# Patient Record
Sex: Female | Born: 1945 | Race: White | Hispanic: No | Marital: Single | Smoking: Never smoker
Health system: Southern US, Community
[De-identification: ages and names within clinical notes are randomized; demographics above are authoritative.]

## PROBLEM LIST (undated history)

## (undated) DIAGNOSIS — N200 Calculus of kidney: Secondary | ICD-10-CM

## (undated) DIAGNOSIS — N289 Disorder of kidney and ureter, unspecified: Secondary | ICD-10-CM

## (undated) HISTORY — PX: STOMACH SURGERY: SHX791

## (undated) HISTORY — PX: LITHOTRIPSY: SUR834

## (undated) HISTORY — PX: MULTIPLE TOOTH EXTRACTIONS: SHX2053

---

## 2014-08-07 ENCOUNTER — Emergency Department: Payer: Self-pay | Admitting: Emergency Medicine

## 2014-08-07 DIAGNOSIS — E860 Dehydration: Secondary | ICD-10-CM | POA: Diagnosis not present

## 2014-08-07 DIAGNOSIS — K529 Noninfective gastroenteritis and colitis, unspecified: Secondary | ICD-10-CM | POA: Diagnosis not present

## 2014-08-07 DIAGNOSIS — R112 Nausea with vomiting, unspecified: Secondary | ICD-10-CM | POA: Diagnosis not present

## 2014-08-07 DIAGNOSIS — R1084 Generalized abdominal pain: Secondary | ICD-10-CM | POA: Diagnosis not present

## 2014-08-07 DIAGNOSIS — N390 Urinary tract infection, site not specified: Secondary | ICD-10-CM | POA: Diagnosis not present

## 2014-08-07 DIAGNOSIS — R05 Cough: Secondary | ICD-10-CM | POA: Diagnosis not present

## 2014-08-09 DIAGNOSIS — E860 Dehydration: Secondary | ICD-10-CM | POA: Diagnosis not present

## 2014-08-09 DIAGNOSIS — R1032 Left lower quadrant pain: Secondary | ICD-10-CM | POA: Diagnosis not present

## 2014-08-09 DIAGNOSIS — R112 Nausea with vomiting, unspecified: Secondary | ICD-10-CM | POA: Diagnosis not present

## 2014-08-09 DIAGNOSIS — D72829 Elevated white blood cell count, unspecified: Secondary | ICD-10-CM | POA: Diagnosis not present

## 2014-08-09 DIAGNOSIS — R000 Tachycardia, unspecified: Secondary | ICD-10-CM | POA: Diagnosis not present

## 2014-08-09 DIAGNOSIS — E8809 Other disorders of plasma-protein metabolism, not elsewhere classified: Secondary | ICD-10-CM | POA: Diagnosis not present

## 2014-08-10 ENCOUNTER — Inpatient Hospital Stay: Payer: Self-pay | Admitting: Urology

## 2014-08-10 DIAGNOSIS — N132 Hydronephrosis with renal and ureteral calculous obstruction: Secondary | ICD-10-CM | POA: Diagnosis not present

## 2014-08-10 DIAGNOSIS — Z801 Family history of malignant neoplasm of trachea, bronchus and lung: Secondary | ICD-10-CM | POA: Diagnosis not present

## 2014-08-10 DIAGNOSIS — N1330 Unspecified hydronephrosis: Secondary | ICD-10-CM | POA: Diagnosis not present

## 2014-08-10 DIAGNOSIS — Z91041 Radiographic dye allergy status: Secondary | ICD-10-CM | POA: Diagnosis not present

## 2014-08-10 DIAGNOSIS — E876 Hypokalemia: Secondary | ICD-10-CM | POA: Diagnosis not present

## 2014-08-10 DIAGNOSIS — N390 Urinary tract infection, site not specified: Secondary | ICD-10-CM | POA: Diagnosis not present

## 2014-08-10 DIAGNOSIS — K219 Gastro-esophageal reflux disease without esophagitis: Secondary | ICD-10-CM | POA: Diagnosis not present

## 2014-08-10 DIAGNOSIS — K5730 Diverticulosis of large intestine without perforation or abscess without bleeding: Secondary | ICD-10-CM | POA: Diagnosis not present

## 2014-08-10 DIAGNOSIS — R1032 Left lower quadrant pain: Secondary | ICD-10-CM | POA: Diagnosis not present

## 2014-08-10 DIAGNOSIS — N201 Calculus of ureter: Secondary | ICD-10-CM | POA: Diagnosis not present

## 2014-08-17 DIAGNOSIS — D649 Anemia, unspecified: Secondary | ICD-10-CM | POA: Diagnosis not present

## 2014-08-17 DIAGNOSIS — E876 Hypokalemia: Secondary | ICD-10-CM | POA: Diagnosis not present

## 2014-08-17 DIAGNOSIS — N200 Calculus of kidney: Secondary | ICD-10-CM | POA: Diagnosis not present

## 2014-08-23 ENCOUNTER — Ambulatory Visit: Payer: Self-pay

## 2014-08-23 DIAGNOSIS — N201 Calculus of ureter: Secondary | ICD-10-CM | POA: Diagnosis not present

## 2014-08-23 DIAGNOSIS — Z960 Presence of urogenital implants: Secondary | ICD-10-CM | POA: Diagnosis not present

## 2014-08-23 DIAGNOSIS — Z9889 Other specified postprocedural states: Secondary | ICD-10-CM | POA: Diagnosis not present

## 2014-08-23 DIAGNOSIS — N200 Calculus of kidney: Secondary | ICD-10-CM | POA: Diagnosis not present

## 2014-08-23 DIAGNOSIS — N209 Urinary calculus, unspecified: Secondary | ICD-10-CM | POA: Diagnosis not present

## 2014-08-29 DIAGNOSIS — N201 Calculus of ureter: Secondary | ICD-10-CM | POA: Diagnosis not present

## 2014-09-01 ENCOUNTER — Ambulatory Visit: Payer: Self-pay | Admitting: Urology

## 2014-09-01 DIAGNOSIS — N200 Calculus of kidney: Secondary | ICD-10-CM | POA: Diagnosis not present

## 2014-09-01 DIAGNOSIS — Z79899 Other long term (current) drug therapy: Secondary | ICD-10-CM | POA: Diagnosis not present

## 2014-09-01 DIAGNOSIS — N201 Calculus of ureter: Secondary | ICD-10-CM | POA: Diagnosis not present

## 2014-09-14 DIAGNOSIS — N209 Urinary calculus, unspecified: Secondary | ICD-10-CM | POA: Diagnosis not present

## 2014-09-15 ENCOUNTER — Ambulatory Visit
Admit: 2014-09-15 | Disposition: A | Discharge status: Home / Self Care | Payer: Self-pay | Attending: Urology | Admitting: Urology

## 2014-09-15 DIAGNOSIS — N200 Calculus of kidney: Secondary | ICD-10-CM | POA: Diagnosis not present

## 2014-09-15 DIAGNOSIS — Z9889 Other specified postprocedural states: Secondary | ICD-10-CM | POA: Diagnosis not present

## 2014-09-15 DIAGNOSIS — N201 Calculus of ureter: Secondary | ICD-10-CM | POA: Diagnosis not present

## 2014-09-20 DIAGNOSIS — N201 Calculus of ureter: Secondary | ICD-10-CM | POA: Diagnosis not present

## 2014-09-30 ENCOUNTER — Other Ambulatory Visit: Payer: Self-pay | Admitting: Urology

## 2014-09-30 DIAGNOSIS — N200 Calculus of kidney: Secondary | ICD-10-CM

## 2014-10-09 DIAGNOSIS — R42 Dizziness and giddiness: Secondary | ICD-10-CM | POA: Diagnosis not present

## 2014-10-09 DIAGNOSIS — R030 Elevated blood-pressure reading, without diagnosis of hypertension: Secondary | ICD-10-CM | POA: Diagnosis not present

## 2014-10-12 DIAGNOSIS — H8110 Benign paroxysmal vertigo, unspecified ear: Secondary | ICD-10-CM | POA: Diagnosis not present

## 2014-10-16 NOTE — Discharge Summary (Signed)
PATIENT NAME:  Roxan HockeyHOMPSON, Ivyana D MR#:  [id number] DATE OF BIRTH:  [birth year]  DATE OF ADMISSION:  08/10/2014 DATE OF DISCHARGE:  08/11/2014  DISCHARGE DIAGNOSES:  1.  Ureteral stone stent placement. 2.  Urinary tract infection. 3.  Hypokalemia.  DISCHARGE MEDICATIONS: 1.  Ondansetron 4 mg oral tablet disintegrating every 4 hours.  3.  Acetaminophen/oxycodone 325/5 mg oral tablet 4 times a day as needed for pain.  4.  Ciprofloxacin 500 mg oral tablet every 12 hours for 4 days.  5.  Flomax 0.4 mg oral capsule once a day.  6.  Oxybutynin 5 mg oral tablet 3 times a day as needed for spasm and pain.   DIET ON DISCHARGE: Regular supplement Ensure.   DIET CONSISTENCY: Regular.   ACTIVITY: As tolerated.   TIMEFRAME TO FOLLOW-UP: [facility]   HISTORY OF PRESENTING ILLNESS: A 69 year old female who has no significant past medical history presented to the Emergency Room with several days' episode of left lower abdominal pain associated with nausea and vomiting and low-grade fever. In the Emergency Department she was found to have a left ureteropelvic junction stone  and she had urinary tract infection. Urology consult was called in and they took her to the operating room and placed a stent in the left ureter and advised to admit the patient to medical service for further monitoring and evaluation of symptoms, so she was admitted to medical services.   HOSPITAL COURSE: Ureteral stone. After placing a stent by urology admitted to medical services. Her pain resolved. She remained on the painmedication.  She had improvement in condition and we discharged her the next day with advice to follow up in the clinic.  Urinary tract infection.  In the hospital urine culture was checked which was negative. She was continued on Cipro and discharged on the same.   Hypokalemia. We give oral replacement.  IMPORTANT LABORATORY RESULTS: Troponin less than 0.02. WBC 18.3 on  admission, hemoglobin was 13, and platelet count was 278,000. Creatinine was 1.60, potassium 3.6, and chloride was 104. Urinalysis was positive with 39 WBCs and 1+ leukocyte esterase.  Further follow-up urinalysis was negative. CT scan of the abdomen and pelvis with contrast showed stone in the left  ureter with hydroureter necrosis and perinephric stranding.  WBC count can down to 10,000 on further follow-up.   TOTAL TIME SPENT ON THIS DISCHARGE:  40 minutes.    ____________________________ Hope PigeonVaibhavkumar G. Elisabeth PigeonVachhani, MD vgv:mc D: 08/14/2014 07:41:57 ET T: 08/14/2014 08:57:25 ET JOB#: [id number]  cc: Hope PigeonVaibhavkumar G. Elisabeth PigeonVachhani, MD, <Dictator> [facility]   [facility]-Er[facility]IBHAVKUMAR Veeda Virgo MD ELECTRONICALLY SIGNED 08/29/2014 12:58

## 2014-10-16 NOTE — H&P (Signed)
PATIENT NAME:  Yvette Woodward, Kenedy D MR#:  [id number] DATE OF BIRTH:  08-02-1945  DATE OF ADMISSION:  08/10/2014  REFERRING PHYSICIAN: Bertram MillardStephen M. Dahlstedt, MD, urologist.  PRIMARY CARE PRACTITIONER: Baruch GoutyMelinda Lada, MD, of [facility].    ADMITTING DOCTOR: Crissie FiguresEdavally N. Shatera Rennert, MD    CHIEF COMPLAINT: Status post urological procedure, cystoscopy and ureteroscopy with left retrograde and left double-J stent placement.   HISTORY OF PRESENT ILLNESS: A 69 year old Caucasian female with no significant past medical history who presented to the Emergency Room earlier last night with several days of left lower abdominal pain with associated nausea and vomiting and low grade fever, was evaluated by the ED physician and was noted to have a left uteropelvic junction stone, which was obstructing and infected. Urology consultation was obtained following which the patient was taken to the OR and underwent urological procedure in early in the early hours of 08/10/2014. Urologist requested a medical consultation and admission to the medical unit for further management. The patient is status post cystoscopy, ureteroscopy, left retrograde, and left double J stent placement done earlier today. Remains, at the current time, in the PACU in stable condition. As mentioned earlier, patient has been having some ongoing left lower abdominal pain with associated nausea, vomiting, low-grade fever for the past few for the several weeks for which she was evaluated and was noted to have an left to the UPJ stone which was obstructing and infected. The patient underwent urological procedure. The patient currently complains of some pain following the procedure. Otherwise, denies any nausea or vomiting at this time. The patient received IV pain medication and also pain control suppository just a few minutes ago.   PAST MEDICAL HISTORY: No history of hypertension, diabetes. No history of any kidney stones in the past.   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   HOME MEDICATIONS:  1.  Bactrim DS.  2.  Ondansetron. 3.  Promethazine for the past few days.   ALLERGIES: CONTRAST DYE, ERYTHROMYCIN.    PAST SURGICAL HISTORY: Tonsillectomy and adenoidectomy.   SOCIAL HISTORY: She is single. No history of smoking. Occasional alcohol intake. Denies any drug usage.   FAMILY HISTORY: Mother with a respiratory failure and father with oat cell lung cancer.   REVIEW OF SYSTEMS:  CONSTITUTIONAL: Positive for low-grade fever with chills for the past several weeks. No weakness. No fatigue.  EYES: Negative for blurred vision, double vision. No pain. No redness. No discharge.  EARS, NOSE, AND THROAT: Negative for ear pain, tinnitus, hearing loss, epistaxis.  RESPIRATORY: Negative for cough, wheezing, dyspnea, hemoptysis, painful respiration.  CARDIOVASCULAR: Negative for chest pain, palpitations, dizziness, syncopal episodes, orthopnea, dyspnea on exertion, pedal edema.  GASTROINTESTINAL: Positive for several weeks of nausea and vomiting with left lower abdominal pain. No hematemesis. No melena.  GENITOURINARY: Positive for frequency, urgency with dysuria for the past several weeks, as noted in the history of present illness.  ENDOCRINE: Negative for were polyuria, nocturia, heat or cold intolerance.  HEMATOLOGIC AND LYMPHATIC: Negative for anemia, easy bruising or bleeding, or swollen glands.  INTEGUMENTARY: Negative for acne, skin rash, or lesions.  MUSCULOSKELETAL: Negative for arthritis, gout.  NEUROLOGICAL: Negative for focal weakness, numbness. No history of CVA or TIA.  PSYCHIATRIC: Negative for anxiety, insomnia, depression.   PHYSICAL EXAMINATION:  VITAL SIGNS: Temperature 97.9 degrees Fahrenheit, pulse rate 101 per minute, respirations 15 per minute, blood pressure 143/86, oxygen saturation 96% on room air; these vitals were taken in the PACU.  GENERAL: Well-developed, well-nourished, alert, in mild distress  because of the pain following urological procedure. Otherwise, comfortably resting in the bed.  HEAD: Atraumatic, normocephalic.  EYES: Pupils are equal, react to light and accommodation. No conjunctival pallor. No icterus. Extraocular movements intact.  NOSE: No drainage. No lesions.  EARS: No drainage. No external lesions.  ORAL CAVITY: No mucosal lesions. No exudates.  NECK: Supple. No JVD. No thyromegaly. No carotid bruit. Range of motion of neck within normal limits.  RESPIRATORY: Good respiratory effort. Not using accessory muscles of respiration. Bilateral vesicular breath sounds present. No rales or rhonchi.  CARDIOVASCULAR: S1, S2 regular. No murmurs, gallops, or clicks. Peripheral pulses equal at carotid, femoral, and pedal pulses. No peripheral edema.  GASTROINTESTINAL: Abdomen is soft. Mild left flank tenderness. Otherwise, no hepatosplenomegaly. No rigidity. No guarding. Bowel sounds present and equal in all 4 quadrants.  GENITOURINARY: Deferred.  MUSCULOSKELETAL: No joint tenderness or effusion. Range of motion is adequate. Strength and tone equal bilaterally.  SKIN: Inspection within normal limits. No obvious wounds.  LYMPHATIC: No cervical lymphadenopathy.  VASCULAR: Good dorsalis pedis and posterior tibial pulses.  NEUROLOGICAL: Alert, awake, and oriented x 3. Cranial nerves II-XII grossly intact. No zoster deficit. Motor strength 5/5 in both upper and lower extremities.  PSYCHIATRIC: Alert, awake, and oriented x 3. Judgment and insight adequate. Memory and mood within normal limits.   ANCILLARY DATA: LABORATORY DATA:  Serum glucose 100, BUN 23, creatinine 1.3, sodium 143, potassium 3.6, chloride 110. Serum bicarbonate 23, calcium 9.1, lipase 90, total protein 7.4, albumin 2.4, total bilirubin 0.4, alkaline phosphatase 140, AST 23, ALT 7. WBC 17.3, hemoglobin 11.7, hematocrit 35.0, platelet count 291,000. Urinalysis: WBC 6 per high-power field, bacteria 1+.   IMAGING STUDIES:  CT of the abdomen and the pelvis:  1.  Obstructing 10 x 9 mm stone in the left proximal ureter with resultant moderate hydroureteronephrosis and perinephric stranding. No additional nonobstructive stones associated with either kidney.  2.  Diverticulosis without diverticulitis.  3.  Small hiatus hernia.   ASSESSMENT AND PLAN: A 69 year old Caucasian female with no significant past medical history who presented to the Emergency Room with several weeks of left-sided abdominal pain with associated nausea and vomiting and low-grade fever and was found to have left ureteral obstructive stone with infection. The patient is status post urological procedure done on 08/10/2014. Admitted to the hospitalist service for further management.  1.  Status post urological procedure, which is cystoscopy, urethroscopy, left retrograde, and left double-J stent placement for infected obstructed left ureteral stone.  PLAN: Admit to medicine. Intravenous pain control medications, intravenous Cipro, intravenous fluids, start clear liquids. Follow up labs and clinically.  2.  Deep vein thrombosis prophylaxis: Sequential compression devices.  3.  Gastrointestinal prophylaxis: Proton pump inhibitor.   CODE STATUS: Full code.   TIME SPENT: 50 minutes.     ____________________________ Crissie Figures, MD enr:bm D: 08/10/2014 05:36:26 ET T: 08/10/2014 05:59:28 ET JOB#: [id number]  cc: Crissie Figures, MD, <Dictator> Baruch Gouty, MD Crissie Figures MD ELECTRONICALLY SIGNED 08/11/2014 13:24

## 2014-10-16 NOTE — Op Note (Signed)
PATIENT NAME:  Yvette Woodward, [name] MR#:  [id number] DATE OF BIRTH:  1945-11-09  DATE OF PROCEDURE:  08/10/2014  PREOPERATIVE DIAGNOSIS:  Obstructing left proximal ureteral stone with pyuria.  POSTOPERATIVE DIAGNOSIS:  Obstructing left proximal ureteral stone with pyuria.  PRINCIPLE PROCEDURE:  Cystoscopy, left retrograde ureteral pyelogram with interpretive fluoroscopy, placement of double J stent in left ureter (24 cm x 6 [place]French Contour without string).    SURGEON: Marcine MatarStephen Dahlstedt, MD  ANESTHESIA:  General endotracheal.  COMPLICATIONS:  None.  INDICATIONS:  This is a 69 year old female with longstanding history of intermittent nausea, vomiting and left lower quadrant pain.  The patient has been seen twice prior to her presentation in the Emergency Room tonight for the above-mentioned symptoms.  She was found to have pyuria, leukocytosis and has had chills for several days.  She has had significant low oral intake.  Evaluation in the Emergency Room, beside the CBC showing leukocytosis and infected urinalysis, included a CT of the abdomen and pelvis.  This revealed significant left hydronephrosis behind an obstructing left proximal ureteral stone 10 mm in size.    Because of the patient's above presentation, it was recommended that she undergo urgent stent placement with eventual definitive stone management after antibiotic therapy.  Risks and complications of the procedure were discussed with the patient.  She will admitted to the hospitalist service.  DESCRIPTION OF PROCEDURE:  The patient was identified in the holding area.  Left side was marked.  She was taken to the operating room where general anesthetic was administered with the endotracheal apparatus. She was placed in the dorsal lithotomy position.  Genitalia and perineum were prepped and draped.  Proper timeout was performed.    A 22 French pan endoscope was advanced into the bladder.  The bladder was inspected circumferentially.  There  were no tumors, trabeculations, or foreign bodies.  The ureteral orifices were normal in configuration location.  Mild squamous metaplasia changes in the trigone.  The left ureter was cannulated with an open ended catheter.  General retrograde pyelogram was performed revealing an obstructing area in the left UPJ with minimal contrast around this.  The ureter was of normal caliber distal to this.  I then advanced the open ended catheter up the patient's ureter.  It was difficult to negotiate a guidewire by the obstructing stone.  I first used a sensor tip guidewire.  This was eventually switched to an angle tip guidewire.  This was eventually navigated past the obstructing stone.  I think, to verify position of the guidewire, advanced the open ended catheter into the left renal pelvis where a hydronephrotic drip was identified. The guidewire was replaced.  The open ended catheter was then removed.  Over top of the guidewire, I then placed a 24 cm x 5 French Contour catheter.  Once adequate positioning was visualized with the cystoscope and the fluoroscope, the guidewire was removed. The proximal and distal curls were seen.  There was significant purulent urine coming through the stent after removal of the guidewire. The bladder was decompressed. The scope was then removed.    The patient was awakened and taken to the PACU in stable condition.  She was administered ciprofloxacin, 400 mg prior to the procedure. She will be admitted on the medicine service for postoperative management.  She will be followed by Dr. Apolinar JunesBrandon.    ____________________________ Bertram MillardStephen M. Dahlstedt, MD :mc D: 08/10/2014 04:13:55 ET T: 08/10/2014 08:58:08 ET JOB#: [id number]  cc: Bertram MillardStephen M. Dahlstedt, MD, <Dictator> STEPHEN  Elza Rafter MD ELECTRONICALLY SIGNED 09/16/2014 11:31

## 2014-10-16 NOTE — H&P (Signed)
Subjective/Chief Complaint Abdominal pain, nausea/emesis   History of Present Illness 69 year old female presented to [facility] ER last night with an extended h/o mild LLQ pain as well as several weeks of nausea and vomiting.  Over the last several weeks she has had decreased [po intake due to the above symptoms. She has been to seek medical attention 3 days ago as well as this am with a PCP in [place]. She has recently experienced chills and perhaps low grade fever. Tues am she was put on an abx as well as a nausea med.   In the ER she was found to have a significant leukocytosis, and a few days ago there were increased WBC in urine. U/A today reveled fewere WBC but bacteria. CT A/P w/ contrast revealed  a 10 mm obstructing UPJ stone. Because of above history, it was recommended that she be admitted after urgent ureteral stent placement.   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  No other PMH   Primary Physician [facility]   Code Status Full Code   Past Med/Surgical Hx:  GERD - Esophageal Reflux:   hiatal hernia:   Denies medical history:   Other, see comments: abdominal surgery tumor removed  Tonsillectomy and Adenoidectomy:   ALLERGIES:  Contrast dye: N/V/Diarrhea  Erythromycin: Unknown  Family and Social History:  Family History Non-Contributory   Place of Living Home   Review of Systems:  Fever/Chills Yes   Cough No   Sputum No   Abdominal Pain Yes   Diarrhea No   Constipation No   Nausea/Vomiting Yes   SOB/DOE No   Chest Pain No   Telemetry Reviewed NSR   Dysuria No   Tolerating Diet No   Physical Exam:  GEN well developed, well nourished, thin   HEENT poor dentition   NECK supple  No masses   RESP normal resp effort  clear BS   CARD regular rate  no murmur  No LE edema   ABD positive tenderness  positive Flank Tenderness  no liver/spleen enlargement  normal BS   LYMPH negative neck   EXTR  negative cyanosis/clubbing, negative edema   SKIN normal to palpation   NEURO cranial nerves intact   PSYCH alert, A+O to time, place, person   Lab Results: Routine Chem:  23-Feb-16 17:16   Creatinine (comp) 1.30  eGFR (Non-African American)  43 (eGFR values <41m/min/1.73 m2 may be an indication of chronic kidney disease (CKD). Calculated eGFR, using the MRDR Study equation, is useful in  patients with stable renal function. The eGFR calculation will not be reliable in acutely ill patients when serum creatinine is changing rapidly. It is not useful in patients on dialysis. The eGFR calculation may not be applicable to patients at the low and high extremes of body sizes, pregnant women, and vegetarians.)  Routine Hem:  23-Feb-16 17:16   WBC (CBC)  17.3  Hemoglobin (CBC)  11.7  Hematocrit (CBC) 35.0  Platelet Count (CBC) 291   Radiology Results: XRay:    21-Feb-16 16:16, Chest PA and Lateral  Chest PA and Lateral  REASON FOR EXAM:    cough  COMMENTS:   LMP: Post Hysterectomy    PROCEDURE: DXR - DXR CHEST PA (OR AP) AND LATERAL  - Aug 07 2014  4:16PM     CLINICAL DATA:  Cough, nausea, vomiting and diarrhea for 4 days.    EXAM:  CHEST  2 VIEW  COMPARISON:  None.    FINDINGS:  Normal sized heart. Clear lungs. Right shoulder calcifications. Left  shoulder degenerative changes. Left upper quadrant abdominal  surgical clips and staples.     IMPRESSION:  No acute abnormality.      Electronically Signed    By: Claudie Revering M.D.    On: 08/07/2014 16:21         Verified By: Gerald Stabs, M.D.,  CT:    24-Feb-16 00:28, CT Abdomen and Pelvis With Contrast  CT Abdomen and Pelvis With Contrast  REASON FOR EXAM:    (1) LLQ pain, leukocytosis, vomiting; (2) LLQ pain,   leukocytosis, vomiting  COMMENTS:   May transport without cardiac monitor    PROCEDURE: CT  - CT ABDOMEN / PELVIS  W  - Aug 10 2014 12:28AM     CLINICAL DATA:  Nausea, vomiting, cough and body  aches. Left lower  quadrant pain. Symptoms for 5-6 days. Elevated white blood cell  count.    EXAM:  CT ABDOMEN AND PELVIS WITH CONTRAST    TECHNIQUE:  Multidetector CT imaging of the abdomen and pelvis was performed  using the standard protocol following bolus administration of  intravenous contrast.    CONTRAST:  85 mL Omnipaque 300 IV.    COMPARISON:  None.    FINDINGS:  There is minimal linear atelectasis at the lung bases. The lung  bases are otherwise clear. Small hiatal hernia.    Obstructing 10 x 9 mm stone in the left proximal ureter with  resultant moderate hydroureteronephrosis and mild perinephric  stranding of the left kidney. The left ureter distal to the stone is  decompressed. There is delayed enhancement and excretion of the left  kidney. There are no additional nonobstructing stones. There are no  right renal stones or right obstructive uropathy.    There are no focal hepatic lesions. Trace periportal edema may be  related to hydration status. The gallbladder is physiologically  distended. The spleen is normal. Minimal thickening of the left  adrenal gland without discrete nodule. Right adrenal gland is  normal. Mild pancreatic atrophy and prominence of the main  pancreatic duct. No peripancreatic inflammatory change.    Stomach is normally distended. There is postsurgical change in the  gastric cardia. There are no dilated or thickened bowel loops.  Moderate colonic diverticulosis primarily in the distal colon  without diverticulitis. The appendix is not definitively identified.  The abdominal aorta is normal in caliber. There is no  retroperitoneal adenopathy. No free air, free fluid, or  intra-abdominal fluid collection.    Within the pelvis the bladder is decompressed. Uterus and adnexa are  normal for age. No adnexal mass. No pelvic free fluid.    There is degenerative change in the lower lumbar spine with  primarily facet arthropathy at L4-L5 and  L5-S1. No acute or  suspicious osseous abnormality.     IMPRESSION:  1. Obstructing 10 x9 mm stone in the left proximal ureter with  resultant moderate hydroureteronephrosis and perinephric stranding.  No additional nonobstructing renal stones in either kidney.  2. Diverticulosis without diverticulitis.  3. Small hiatal hernia.      Electronically Signed    By: Jeb Levering M.D.    On: 08/10/2014 01:03         Verified By: Rollene Fare. Marisue Humble, M.D.,    Assessment/Admission Diagnosis Probable infected/obstructing left UPJ stone. Associated long trerm N/V   Plan I have recommended urgent cysto/left J2 stent placement. Risks,  complications as well as eventual stone management discussed w/ pt/   Electronic Signatures: Dahlstedt, Lillette Boxer (MD)  (Signed 24-Feb-16 02:41)  Authored: CHIEF COMPLAINT and HISTORY, PAST MEDICAL/SURGIAL HISTORY, ALLERGIES, FAMILY AND SOCIAL HISTORY, REVIEW OF SYSTEMS, PHYSICAL EXAM, LABS, Radiology, ASSESSMENT AND PLAN   Last Updated: 24-Feb-16 02:41 by Jorja Loa (MD)

## 2014-10-16 NOTE — Consult Note (Signed)
Chief Complaint:  Subjective/Chief Complaint No fevers since stent placement.  Pain improved, reports feeling "weak", some nausea.  Tolerating clears.  c/o increased urinary frequency.   VITAL SIGNS/ANCILLARY NOTES: **Vital Signs.:   25-Feb-16 08:42  Vital Signs Type Q 4hr  Temperature Temperature (F) 98.4  Celsius 36.8  Temperature Source oral  Pulse Pulse 95  Respirations Respirations 17  Systolic BP Systolic BP 009  Diastolic BP (mmHg) Diastolic BP (mmHg) 82  Mean BP 102  Pulse Ox % Pulse Ox % 94  Pulse Ox Activity Level  At rest  Oxygen Delivery Room Air/ 21 %  *Intake and Output.:   Daily 25-Feb-16 07:00  Grand Totals Intake:  3420.2 Output:  2000    Net:  1420.2 24 Hr.:  1420.2  Oral Intake      In:  600  IV (Primary)      In:  2820.2  Urine ml     Out:  2000  Length of Stay Totals Intake:  3420.2 Output:  2000    Net:  1420.2   Brief Assessment:  GEN well developed, well nourished, no acute distress   Cardiac Regular  -- LE edema   Respiratory normal resp effort  clear BS  no use of accessory muscles   Gastrointestinal Normal   Gastrointestinal details normal Soft  Nontender  Nondistended  No gaurding  No rigidity   EXTR negative cyanosis/clubbing   Additional Physical Exam No CVA tenderness bilaterally   Lab Results: Routine Chem:  25-Feb-16 04:17   Glucose, Serum  127  BUN 13  Creatinine (comp) 1.07  Sodium, Serum  147  Potassium, Serum  3.1  Chloride, Serum  112  CO2, Serum 24  Calcium (Total), Serum  7.8  Anion Gap 11  Osmolality (calc) 294  eGFR (African American) >60  eGFR (Non-African American)  54 (eGFR values <74m/min/1.73 m2 may be an indication of chronic kidney disease (CKD). Calculated eGFR, using the MRDR Study equation, is useful in  patients with stable renal function. The eGFR calculation will not be reliable in acutely ill patients when serum creatinine is changing rapidly. It is not useful in patients on dialysis. The  eGFR calculation may not be applicable to patients at the low and high extremes of body sizes, pregnant women, and vegetarians.)  Routine Hem:  25-Feb-16 04:17   WBC (CBC) 10.1  RBC (CBC)  3.27  Hemoglobin (CBC)  10.0  Hematocrit (CBC)  30.4  Platelet Count (CBC) 252  MCV 93  MCH 30.5  MCHC 32.8  RDW  14.6  Neutrophil % 81.6  Lymphocyte % 12.0  Monocyte % 5.8  Eosinophil % 0.4  Basophil % 0.2  Neutrophil #  8.2  Lymphocyte # 1.2  Monocyte # 0.6  Eosinophil # 0.0  Basophil # 0.0 (Result(s) reported on 11 Aug 2014 at 05:24AM.)   Assessment/Plan:  Assessment/Plan:  Assessment 69yo F with obstructing 10 x9 mm stone in the left proximal ureter POD 1 s/p ureteral stent placemenmt by Dr.Dahlstedt.  Pain improved, HD stable, labs improving.  Per operative report, there was copious purulent material that drained following stent placement concerning for infection.  Cr and WBC improving.   Plan 1) OK to discharge from GU perspective 2) Please discharge home on abx, Flomax 0.4 mg daily, and ditropan 5 mg po tid prn bladder spasms for stent pain 3) Follow up with [facility] book definative stone procedure  Discussed this in detail with the patient today today.  Electronic Signatures: Brandon, Ashley J (MD)  (Signed 25-Feb-16 09:33)  Authored: Chief Complaint, VITAL SIGNS/ANCILLARY NOTES, Brief Assessment, Lab Results, Assessment/Plan   Last Updated: 25-Feb-16 09:33 by Brandon, Ashley J (MD) 

## 2014-10-17 DIAGNOSIS — H8111 Benign paroxysmal vertigo, right ear: Secondary | ICD-10-CM | POA: Diagnosis not present

## 2014-10-20 ENCOUNTER — Ambulatory Visit: Payer: Self-pay

## 2014-11-23 ENCOUNTER — Telehealth: Payer: Self-pay | Admitting: *Deleted

## 2014-11-23 NOTE — Telephone Encounter (Signed)
Called patient to attempt to reschedule renal Ultrasound, but had to leave a message on voicemail.  I instructed her to call the scheduling department at [phone] to reschedule the ultrasound appointment and then schedule a follow up with our office.

## 2014-11-30 ENCOUNTER — Telehealth: Payer: Self-pay | Admitting: Urology

## 2014-11-30 NOTE — Telephone Encounter (Signed)
Patient is scheduled for a renal ultrasound on Friday, 12/02/14 at 2:00pm at the [facility]  Patient would like a phone call with results of the renal ultrasound, if possible.

## 2014-12-02 ENCOUNTER — Ambulatory Visit
Admission: RE | Admit: 2014-12-02 | Discharge: 2014-12-02 | Disposition: A | Discharge status: Home / Self Care | Payer: Medicare Other | Source: Ambulatory Visit | Attending: Urology | Admitting: Urology

## 2014-12-02 DIAGNOSIS — Z87442 Personal history of urinary calculi: Secondary | ICD-10-CM | POA: Diagnosis not present

## 2014-12-02 DIAGNOSIS — N200 Calculus of kidney: Secondary | ICD-10-CM | POA: Diagnosis not present

## 2014-12-05 ENCOUNTER — Telehealth: Payer: Self-pay

## 2014-12-05 NOTE — Telephone Encounter (Signed)
LMOM

## 2014-12-05 NOTE — Telephone Encounter (Signed)
Spoke with pt in reference to u/s. Pt voiced understanding. Can you please set up this KUB?

## 2014-12-05 NOTE — Telephone Encounter (Signed)
-----   Message from Ashley Brandon, MD sent at 12/02/2014  4:32 PM EDT ----- Please call this patient and let her know that her ultrasound was good.  She had no swelling of her kidneys and no masses.She does have a possible stone vs. Small clot in the lower part of her left kidney which is NOT blocking and should no be causing pain.  I would like to see her back in 6 months with a KUB prior to monitor this stone.    Please forward notes to front desk to schedule.  Ashley Brandon, MD 

## 2014-12-05 NOTE — Telephone Encounter (Signed)
-----   Message from Vanna Scotland, MD sent at 12/02/2014  4:32 PM EDT ----- Please call this patient and let her know that her ultrasound was good.  She had no swelling of her kidneys and no masses.She does have a possible stone vs. Small clot in the lower part of her left kidney which is NOT blocking and should no be causing pain.  I would like to see her back in 6 months with a KUB prior to monitor this stone.    Please forward notes to front desk to schedule.  Vanna Scotland, MD

## 2014-12-06 NOTE — Telephone Encounter (Signed)
Xrays do not require scheduling or authorization.  Patients are able to just walk-in to the Outpatient Imaging facility.  An order needs to be placed for the KUB.

## 2014-12-07 ENCOUNTER — Other Ambulatory Visit: Payer: Self-pay

## 2014-12-07 DIAGNOSIS — N200 Calculus of kidney: Secondary | ICD-10-CM

## 2014-12-07 NOTE — Telephone Encounter (Signed)
Order has been placed. LMOM letting pt know order has been placed. Cw,lpn

## 2015-01-12 ENCOUNTER — Other Ambulatory Visit: Payer: Self-pay

## 2015-01-12 DIAGNOSIS — N200 Calculus of kidney: Secondary | ICD-10-CM

## 2015-01-13 ENCOUNTER — Other Ambulatory Visit: Payer: Self-pay

## 2015-01-13 DIAGNOSIS — N200 Calculus of kidney: Secondary | ICD-10-CM

## 2015-03-15 DIAGNOSIS — H4010X2 Unspecified open-angle glaucoma, moderate stage: Secondary | ICD-10-CM | POA: Diagnosis not present

## 2015-03-21 ENCOUNTER — Ambulatory Visit: Payer: Self-pay | Admitting: Urology

## 2015-03-21 ENCOUNTER — Encounter: Payer: Self-pay | Admitting: Urology

## 2015-05-31 DIAGNOSIS — H401134 Primary open-angle glaucoma, bilateral, indeterminate stage: Secondary | ICD-10-CM | POA: Diagnosis not present

## 2015-05-31 DIAGNOSIS — H2513 Age-related nuclear cataract, bilateral: Secondary | ICD-10-CM | POA: Diagnosis not present

## 2015-08-01 DIAGNOSIS — H401134 Primary open-angle glaucoma, bilateral, indeterminate stage: Secondary | ICD-10-CM | POA: Diagnosis not present

## 2015-09-06 DIAGNOSIS — H401131 Primary open-angle glaucoma, bilateral, mild stage: Secondary | ICD-10-CM | POA: Diagnosis not present

## 2016-04-01 IMAGING — CT CT ABD-PELV W/ CM
2 of 5 series · 16 of 46 positions shown, 18 images · IV contrast (omnipaque)
Comparison: None.

CLINICAL DATA: Nausea, vomiting, cough and body aches. Left lower
quadrant pain. Symptoms for 5-6 days. Elevated white blood cell
count.

EXAM:
CT ABDOMEN AND PELVIS WITH CONTRAST
TECHNIQUE: Multidetector CT imaging of the abdomen and pelvis was performed
using the standard protocol following bolus administration of
intravenous contrast.
CONTRAST:  85 mL Omnipaque 300 IV.

[Series 2: routine abd pel with · axial · 0.62mm/px · z∈[-960,-590]mm · 13 of 84 slices shown, 15 images]
[im 5/84  soft-tissue]
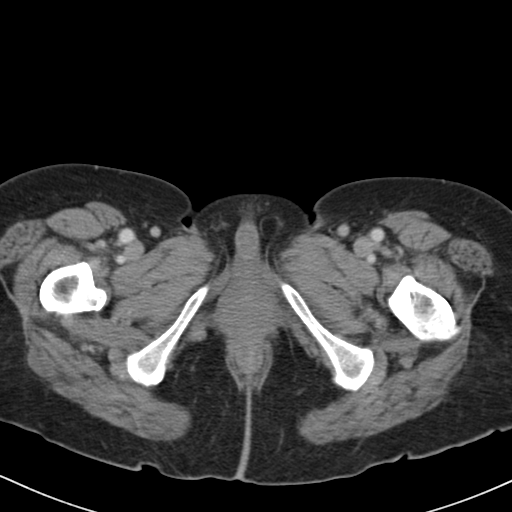
[im 5/84  bone]
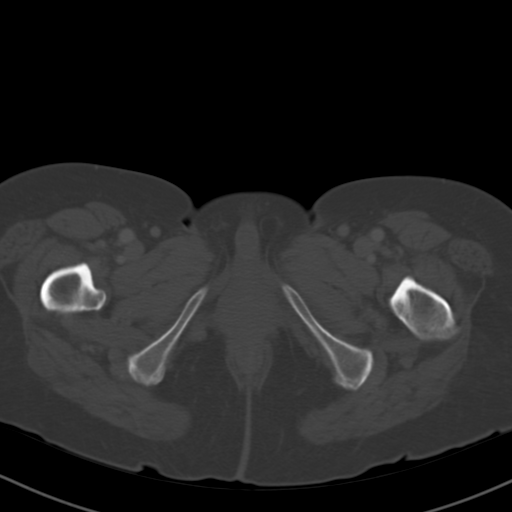
[im 10/84  soft-tissue]
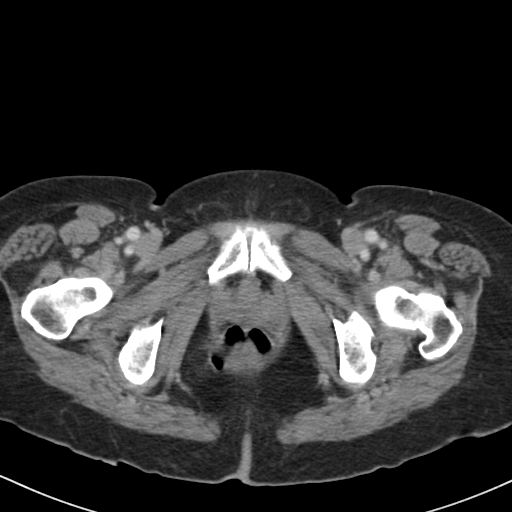
[im 20/84  soft-tissue]
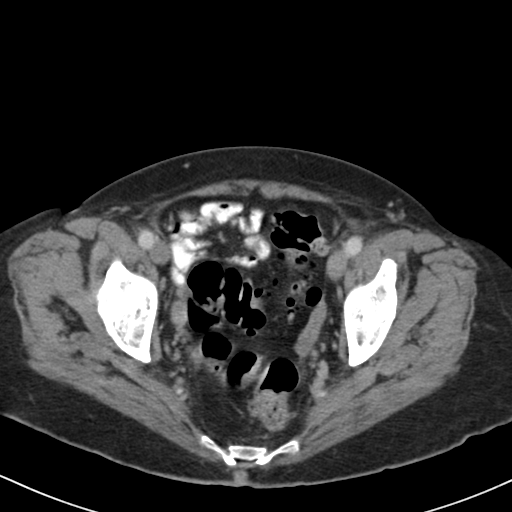
[im 25/84  soft-tissue]
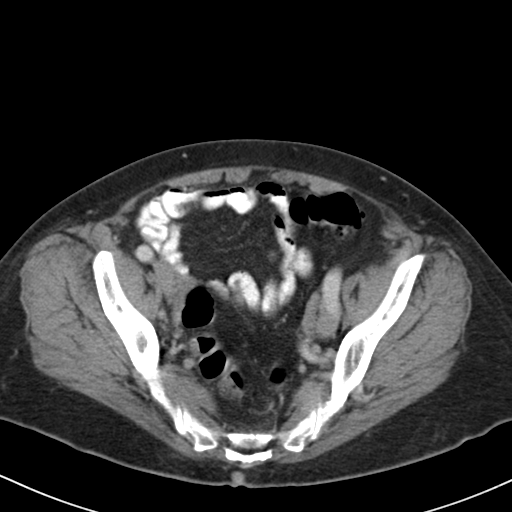
[im 30/84  soft-tissue]
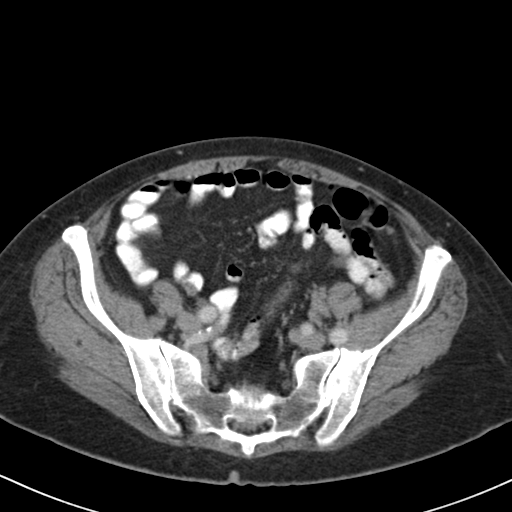
[im 35/84  soft-tissue]
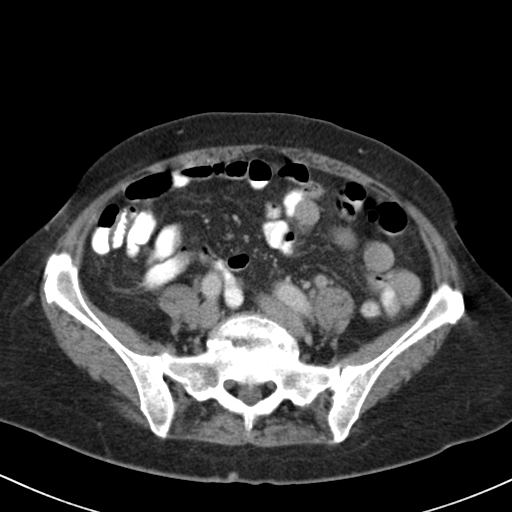
[im 44/84  soft-tissue]
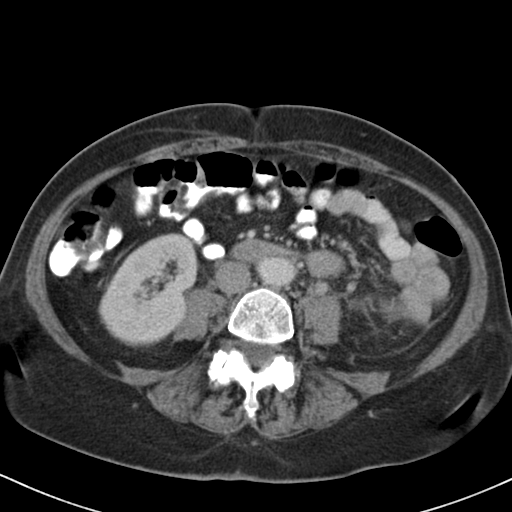
[im 49/84  soft-tissue]
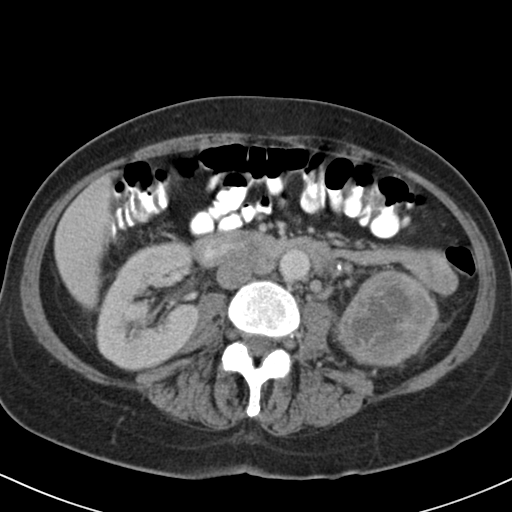
[im 54/84  soft-tissue]
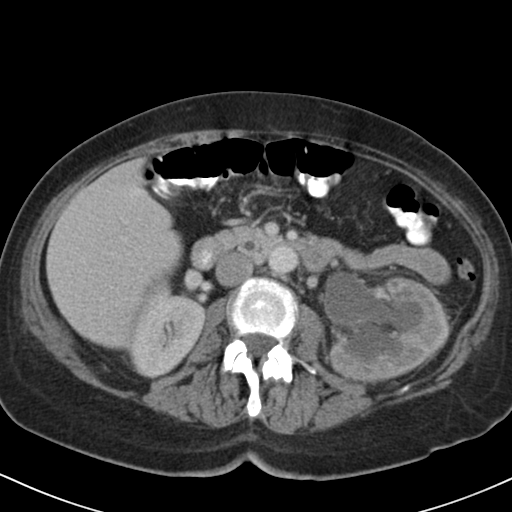
[im 54/84  bone]
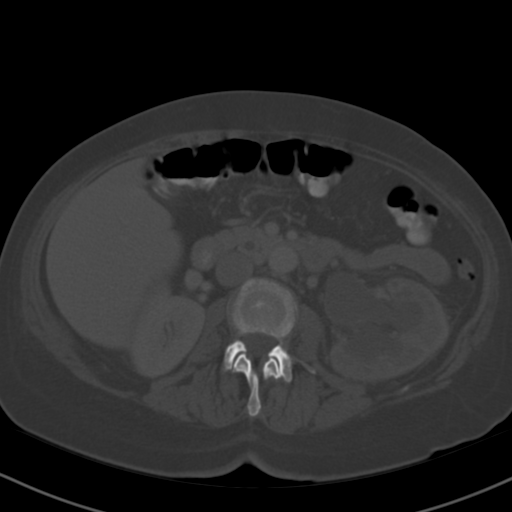
[im 59/84  soft-tissue]
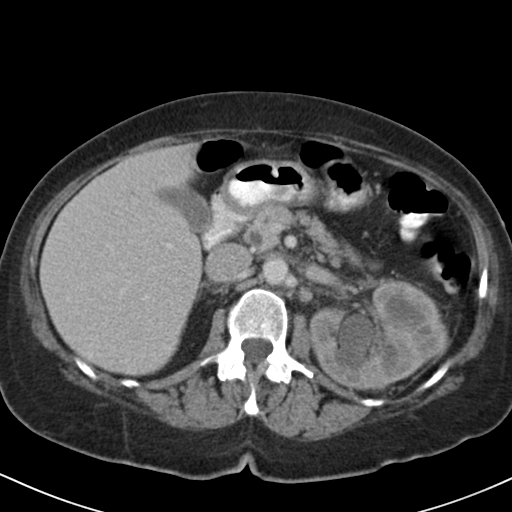
[im 64/84  soft-tissue]
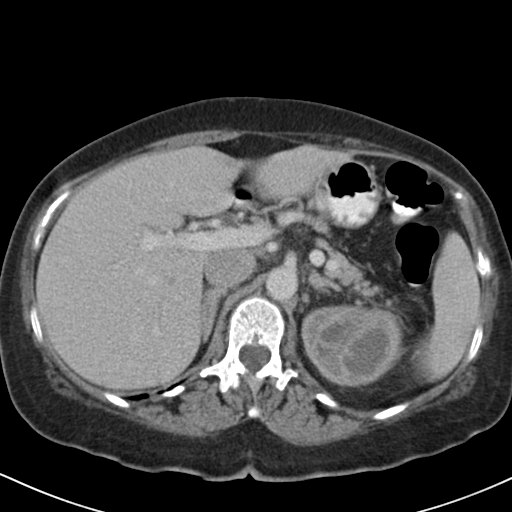
[im 74/84  soft-tissue]
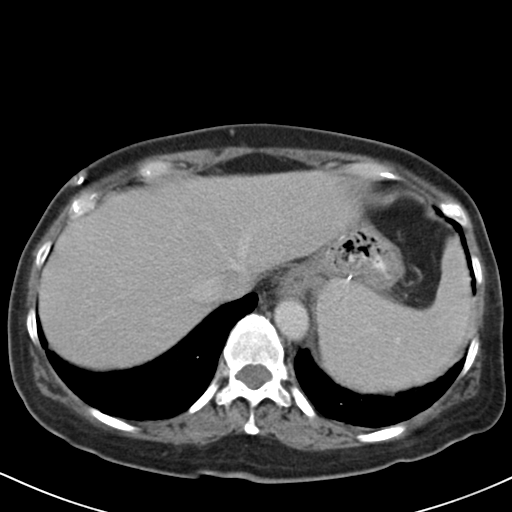
[im 79/84  soft-tissue]
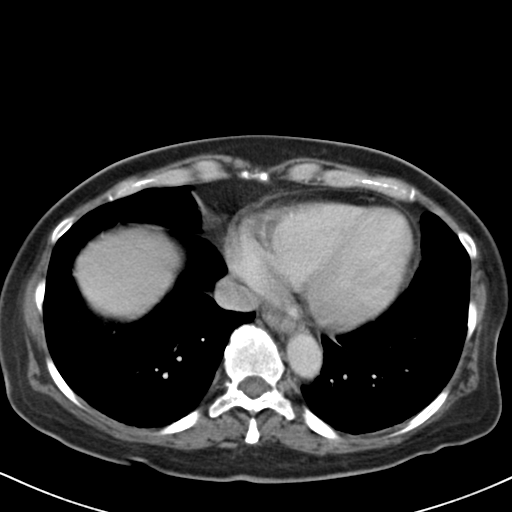

[Series 5: cor routine abd pel with · coronal · 0.78mm/px · 3 of 120 slices shown]
[im 40/120  soft-tissue]
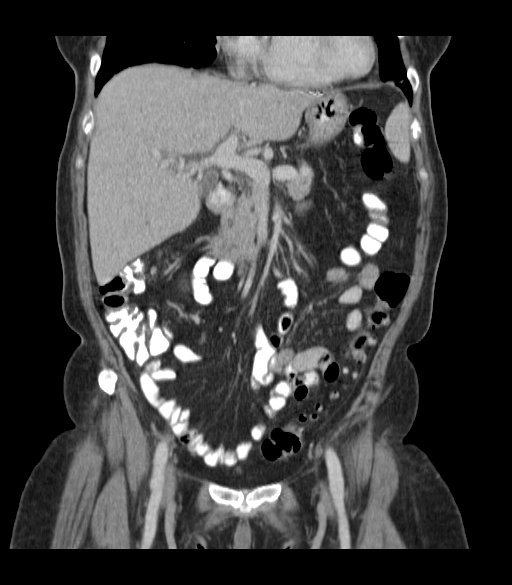
[im 53/120  soft-tissue]
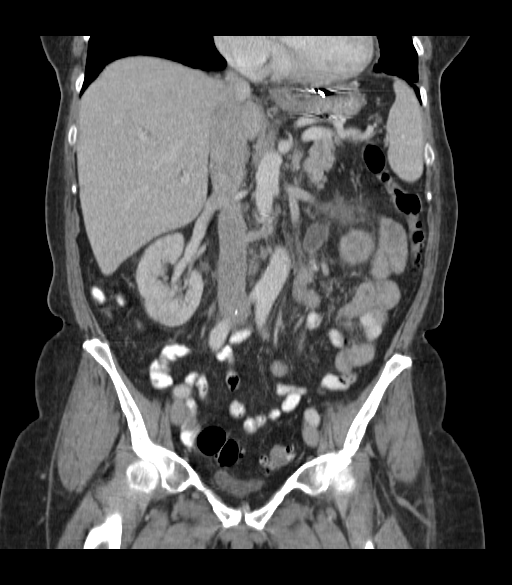
[im 67/120  soft-tissue]
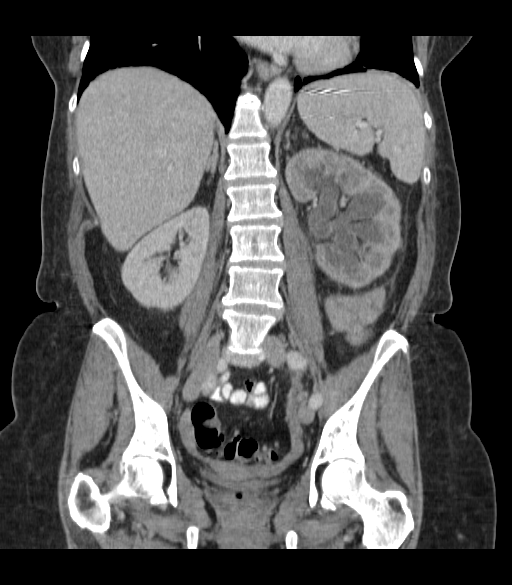

[16 of 46 positions shown; findings below may reference images not displayed]

FINDINGS: There is minimal linear atelectasis at the lung bases. The lung
bases are otherwise clear. Small hiatal hernia.

Obstructing 10 x 9 mm stone in the left proximal ureter with
resultant moderate hydroureteronephrosis and mild perinephric
stranding of the left kidney. The left ureter distal to the stone is
decompressed. There is delayed enhancement and excretion of the left
kidney. There are no additional nonobstructing stones. There are no
right renal stones or right obstructive uropathy.

There are no focal hepatic lesions. Trace periportal edema may be
related to hydration status. The gallbladder is physiologically
distended. The spleen is normal. Minimal thickening of the left
adrenal gland without discrete nodule. Right adrenal gland is
normal. Mild pancreatic atrophy and prominence of the main
pancreatic duct. No peripancreatic inflammatory change.

Stomach is normally distended. There is postsurgical change in the
gastric cardia. There are no dilated or thickened bowel loops.
Moderate colonic diverticulosis primarily in the distal colon
without diverticulitis. The appendix is not definitively identified.

The abdominal aorta is normal in caliber. There is no
retroperitoneal adenopathy. No free air, free fluid, or
intra-abdominal fluid collection.

Within the pelvis the bladder is decompressed. Uterus and adnexa are
normal for age. No adnexal mass. No pelvic free fluid.

There is degenerative change in the lower lumbar spine with
primarily facet arthropathy at L4-L5 and L5-S1. No acute or
suspicious osseous abnormality.
IMPRESSION: 1. Obstructing 10 x 9 mm stone in the left proximal ureter with
resultant moderate hydroureteronephrosis and perinephric stranding.
No additional nonobstructing renal stones in either kidney.
2. Diverticulosis without diverticulitis.
3. Small hiatal hernia.

## 2016-09-03 ENCOUNTER — Encounter: Payer: Self-pay | Admitting: Emergency Medicine

## 2016-09-03 ENCOUNTER — Emergency Department
Admission: EM | Admit: 2016-09-03 | Discharge: 2016-09-03 | Disposition: A | Discharge status: Home / Self Care | Payer: Medicare Other | Attending: Emergency Medicine | Admitting: Emergency Medicine

## 2016-09-03 DIAGNOSIS — R112 Nausea with vomiting, unspecified: Secondary | ICD-10-CM | POA: Diagnosis not present

## 2016-09-03 DIAGNOSIS — J069 Acute upper respiratory infection, unspecified: Secondary | ICD-10-CM | POA: Diagnosis not present

## 2016-09-03 DIAGNOSIS — R05 Cough: Secondary | ICD-10-CM | POA: Diagnosis present

## 2016-09-03 DIAGNOSIS — N390 Urinary tract infection, site not specified: Secondary | ICD-10-CM

## 2016-09-03 DIAGNOSIS — R1110 Vomiting, unspecified: Secondary | ICD-10-CM | POA: Diagnosis not present

## 2016-09-03 HISTORY — DX: Disorder of kidney and ureter, unspecified: N28.9

## 2016-09-03 LAB — URINALYSIS, COMPLETE (UACMP) WITH MICROSCOPIC
BILIRUBIN URINE: NEGATIVE
GLUCOSE, UA: NEGATIVE mg/dL
KETONES UR: 20 mg/dL — AB
NITRITE: POSITIVE — AB
PH: 5 (ref 5.0–8.0)
PROTEIN: 100 mg/dL — AB
Specific Gravity, Urine: 1.026 (ref 1.005–1.030)

## 2016-09-03 LAB — COMPREHENSIVE METABOLIC PANEL
ALBUMIN: 4.3 g/dL (ref 3.5–5.0)
ALK PHOS: 71 U/L (ref 38–126)
ALT: 15 U/L (ref 14–54)
ANION GAP: 11 (ref 5–15)
AST: 28 U/L (ref 15–41)
BILIRUBIN TOTAL: 0.7 mg/dL (ref 0.3–1.2)
BUN: 16 mg/dL (ref 6–20)
CALCIUM: 9 mg/dL (ref 8.9–10.3)
CO2: 22 mmol/L (ref 22–32)
Chloride: 101 mmol/L (ref 101–111)
Creatinine, Ser: 1.08 mg/dL — ABNORMAL HIGH (ref 0.44–1.00)
GFR calc non Af Amer: 51 mL/min — ABNORMAL LOW (ref >60)
GFR, EST AFRICAN AMERICAN: 59 mL/min — AB (ref >60)
GLUCOSE: 124 mg/dL — AB (ref 65–99)
POTASSIUM: 3.5 mmol/L (ref 3.5–5.1)
SODIUM: 134 mmol/L — AB (ref 135–145)
TOTAL PROTEIN: 7.6 g/dL (ref 6.5–8.1)

## 2016-09-03 LAB — CBC
HEMATOCRIT: 42.8 % (ref 35.0–47.0)
HEMOGLOBIN: 15 g/dL (ref 12.0–16.0)
MCH: 31.9 pg (ref 26.0–34.0)
MCHC: 35 g/dL (ref 32.0–36.0)
MCV: 91 fL (ref 80.0–100.0)
Platelets: 162 10*3/uL (ref 150–440)
RBC: 4.71 MIL/uL (ref 3.80–5.20)
RDW: 13.2 % (ref 11.5–14.5)
WBC: 6.6 10*3/uL (ref 3.6–11.0)

## 2016-09-03 LAB — LIPASE, BLOOD: Lipase: 23 U/L (ref 11–51)

## 2016-09-03 MED ORDER — NITROFURANTOIN MONOHYD MACRO 100 MG PO CAPS
ORAL_CAPSULE | ORAL | Status: AC
  Administered 2016-09-03: 100 mg via ORAL
  Filled 2016-09-03: qty 1

## 2016-09-03 MED ORDER — NITROFURANTOIN MONOHYD MACRO 100 MG PO CAPS: 100.0000 mg | ORAL_CAPSULE | Freq: Two times a day (BID) | ORAL | 0 refills | Status: AC

## 2016-09-03 MED ORDER — ONDANSETRON HCL 4 MG PO TABS: 4.0000 mg | ORAL_TABLET | Freq: Every day | ORAL | 0 refills | Status: DC | PRN

## 2016-09-03 MED ORDER — NITROFURANTOIN MONOHYD MACRO 100 MG PO CAPS
100.0000 mg | ORAL_CAPSULE | Freq: Once | ORAL | Status: AC
  Administered 2016-09-03: 100 mg via ORAL
  Filled 2016-09-03: qty 1

## 2016-09-03 NOTE — ED Notes (Signed)
Pt reports that she feels dehydrated - she says she has had a cough all weekend with chest congestion (this has cleared today) - today she started with nausea/vomiting - pt reports having loose stools yesterday - denies any pain - denies urinary frequency or pain with urination

## 2016-09-03 NOTE — ED Triage Notes (Signed)
Patient presents to the ED with cough and sore throat x 1 week and vomiting x 2 today.  Patient states, "it was clear."  Patient reports drinking water since throwing up without further emesis.  Patient states, "I want to know if I'm dehydrated."  Patient is in no obvious distress at this time.  Patient denies abdominal pain.  Reports 1 episode of diarrhea yesterday.  Patient states, "I thought I might have a kidney stone."  Patient denies any flank pain.  Ambulatory with steady gate.  Skin is warm and dry.

## 2016-09-03 NOTE — ED Provider Notes (Signed)
[facility] Emergency Department Provider Note  ____________________________________________   First MD Initiated Contact with Patient 09/03/16 1823     (approximate)  I have reviewed the triage vital signs and the nursing notes.   HISTORY  Chief Complaint Cough; Sore Throat; and Emesis    HPI Yvette Woodward is a 71 y.o. female without any chronic medical conditions was presenting emergency department with 3 days of cough, nasal congestion as well as vomiting. She denies any dysuria. No known sick contacts. Says that her cough has been getting better over the past several days. Patient is concerned that she may be dehydrated because she says her lips are dry and she rarely vomits. One episode of diarrhea yesterday but none today. No pain to the abdomen or the back.   Past Medical History:  Diagnosis Date  . Renal disorder    kidney stones    There are no active problems to display for this patient.   Past Surgical History:  Procedure Laterality Date  . LITHOTRIPSY    . STOMACH SURGERY      Prior to Admission medications   Not on File    Allergies Erythromycin  No family history on file.  Social History Social History  Substance Use Topics  . Smoking status: Never Smoker  . Smokeless tobacco: Never Used  . Alcohol use Yes     Comment: occasionally    Review of Systems Constitutional: No fever/chills Eyes: No visual changes. ENT: No sore throat. Cardiovascular: Denies chest pain. Respiratory: as above Gastrointestinal: No abdominal pain.    No constipation. Genitourinary: Negative for dysuria. Musculoskeletal: Negative for back pain. Skin: Negative for rash. Neurological: Negative for headaches, focal weakness or numbness.  10-point ROS otherwise negative.  ____________________________________________   PHYSICAL EXAM:  VITAL SIGNS: ED Triage Vitals  Enc Vitals Group     BP 09/03/16 1658 (!) 166/85     Pulse Rate  09/03/16 1658 65     Resp 09/03/16 1658 18     Temp 09/03/16 1658 98.5 F (36.9 C)     Temp Source 09/03/16 1658 Oral     SpO2 09/03/16 1658 99 %     Weight 09/03/16 1705 135 lb (61.2 kg)     Height 09/03/16 1705 5\' 3"  (1.6 m)     Head Circumference --      Peak Flow --      Pain Score 09/03/16 1701 0     Pain Loc --      Pain Edu? --      Excl. in GC? --     Constitutional: Alert and oriented. Well appearing and in no acute distress. Eyes: Conjunctivae are normal. PERRL. EOMI. Head: Atraumatic. Nose: No congestion/rhinnorhea. Mouth/Throat: Mucous membranes are moist.  Oropharynx non-erythematous. Neck: No stridor.   Cardiovascular: Normal rate, regular rhythm. Grossly normal heart sounds.   Respiratory: Normal respiratory effort.  No retractions. Lungs CTAB. Gastrointestinal: Soft and nontender. No distention.  No CVA tenderness. Musculoskeletal: No lower extremity tenderness nor edema.  No joint effusions. Neurologic:  Normal speech and language. No gross focal neurologic deficits are appreciated.  Skin:  Skin is warm, dry and intact. No rash noted. Psychiatric: Mood and affect are normal. Speech and behavior are normal.  ____________________________________________   LABS (all labs ordered are listed, but only abnormal results are displayed)  Labs Reviewed  COMPREHENSIVE METABOLIC PANEL - Abnormal; Notable for the following:       Result Value   Sodium  134 (*)    Glucose, Bld 124 (*)    Creatinine, Ser 1.08 (*)    GFR calc non Af Amer 51 (*)    GFR calc Af Amer 59 (*)    All other components within normal limits  URINALYSIS, COMPLETE (UACMP) WITH MICROSCOPIC - Abnormal; Notable for the following:    Color, Urine AMBER (*)    APPearance CLOUDY (*)    Hgb urine dipstick SMALL (*)    Ketones, ur 20 (*)    Protein, ur 100 (*)    Nitrite POSITIVE (*)    Leukocytes, UA TRACE (*)    Bacteria, UA MANY (*)    Squamous Epithelial / LPF 0-5 (*)    All other components  within normal limits  LIPASE, BLOOD  CBC   ____________________________________________  EKG   ____________________________________________  RADIOLOGY   ____________________________________________   PROCEDURES  Procedure(s) performed:   Procedures  Critical Care performed:   ____________________________________________   INITIAL IMPRESSION / ASSESSMENT AND PLAN / ED COURSE  Pertinent labs & imaging results that were available during my care of the patient were reviewed by me and considered in my medical decision making (see chart for details).  Patient with improving cough. Likely viral URI but found to have UTI. We'll treat UTI as well as give nausea medication. Patient appears well. Reassuring lab work. Will be discharged home. She is understanding the plan and willing to comply. We also discussed the diagnosis.      ____________________________________________   FINAL CLINICAL IMPRESSION(S) / ED DIAGNOSES  Upper respiratory infection. UTI. Vomiting.    NEW MEDICATIONS STARTED DURING THIS VISIT:  New Prescriptions   No medications on file     Note:  This document was prepared using Dragon voice recognition software and may include unintentional dictation errors.    Myrna Blazeravid Matthew Kairi Harshbarger, MD 09/03/16 [phone]

## 2016-10-11 DIAGNOSIS — H401131 Primary open-angle glaucoma, bilateral, mild stage: Secondary | ICD-10-CM | POA: Diagnosis not present

## 2017-02-22 ENCOUNTER — Emergency Department: Payer: Medicare Other

## 2017-02-22 ENCOUNTER — Emergency Department
Admission: EM | Admit: 2017-02-22 | Discharge: 2017-02-22 | Disposition: A | Discharge status: Home / Self Care | Payer: Medicare Other | Attending: Emergency Medicine | Admitting: Emergency Medicine

## 2017-02-22 DIAGNOSIS — N200 Calculus of kidney: Secondary | ICD-10-CM | POA: Diagnosis not present

## 2017-02-22 DIAGNOSIS — J189 Pneumonia, unspecified organism: Secondary | ICD-10-CM

## 2017-02-22 DIAGNOSIS — J181 Lobar pneumonia, unspecified organism: Secondary | ICD-10-CM | POA: Insufficient documentation

## 2017-02-22 DIAGNOSIS — R05 Cough: Secondary | ICD-10-CM | POA: Diagnosis not present

## 2017-02-22 DIAGNOSIS — R350 Frequency of micturition: Secondary | ICD-10-CM | POA: Diagnosis not present

## 2017-02-22 HISTORY — DX: Calculus of kidney: N20.0

## 2017-02-22 LAB — CBC WITH DIFFERENTIAL/PLATELET
Basophils Absolute: 0 10*3/uL (ref 0–0.1)
Basophils Relative: 0 %
Eosinophils Absolute: 0 10*3/uL (ref 0–0.7)
Eosinophils Relative: 0 %
HCT: 43.8 % (ref 35.0–47.0)
Hemoglobin: 15 g/dL (ref 12.0–16.0)
Lymphocytes Relative: 4 %
Lymphs Abs: 0.8 10*3/uL — ABNORMAL LOW (ref 1.0–3.6)
MCH: 31.2 pg (ref 26.0–34.0)
MCHC: 34.4 g/dL (ref 32.0–36.0)
MCV: 90.9 fL (ref 80.0–100.0)
Monocytes Absolute: 1 10*3/uL — ABNORMAL HIGH (ref 0.2–0.9)
Monocytes Relative: 5 %
Neutro Abs: 18.3 10*3/uL — ABNORMAL HIGH (ref 1.4–6.5)
Neutrophils Relative %: 91 %
Platelets: 202 10*3/uL (ref 150–440)
RBC: 4.82 MIL/uL (ref 3.80–5.20)
RDW: 12.7 % (ref 11.5–14.5)
WBC: 20.2 10*3/uL — ABNORMAL HIGH (ref 3.6–11.0)

## 2017-02-22 LAB — BASIC METABOLIC PANEL
ANION GAP: 13 (ref 5–15)
BUN: 13 mg/dL (ref 6–20)
CALCIUM: 9.5 mg/dL (ref 8.9–10.3)
CO2: 24 mmol/L (ref 22–32)
CREATININE: 0.9 mg/dL (ref 0.44–1.00)
Chloride: 100 mmol/L — ABNORMAL LOW (ref 101–111)
GFR calc Af Amer: >60 mL/min (ref >60)
GLUCOSE: 138 mg/dL — AB (ref 65–99)
Potassium: 3.5 mmol/L (ref 3.5–5.1)
Sodium: 137 mmol/L (ref 135–145)

## 2017-02-22 LAB — URINALYSIS, COMPLETE (UACMP) WITH MICROSCOPIC
BACTERIA UA: NONE SEEN
BILIRUBIN URINE: NEGATIVE
GLUCOSE, UA: 50 mg/dL — AB
KETONES UR: 20 mg/dL — AB
Leukocytes, UA: NEGATIVE
Nitrite: NEGATIVE
PROTEIN: 100 mg/dL — AB
Specific Gravity, Urine: 1.014 (ref 1.005–1.030)
pH: 5 (ref 5.0–8.0)

## 2017-02-22 MED ORDER — ONDANSETRON 4 MG PO TBDP
4.0000 mg | ORAL_TABLET | Freq: Once | ORAL | Status: AC
  Administered 2017-02-22: 4 mg via ORAL
  Filled 2017-02-22: qty 1

## 2017-02-22 MED ORDER — ONDANSETRON 4 MG PO TBDP: 4.0000 mg | ORAL_TABLET | Freq: Three times a day (TID) | ORAL | 0 refills | Status: DC | PRN

## 2017-02-22 MED ORDER — LEVOFLOXACIN 500 MG PO TABS: 500.0000 mg | ORAL_TABLET | Freq: Every day | ORAL | 0 refills | Status: AC

## 2017-02-22 MED ORDER — SODIUM CHLORIDE 0.9 % IV BOLUS (SEPSIS)
1000.0000 mL | Freq: Once | INTRAVENOUS | Status: AC
  Administered 2017-02-22: 1000 mL via INTRAVENOUS

## 2017-02-22 MED ORDER — BENZONATATE 100 MG PO CAPS: 100.0000 mg | ORAL_CAPSULE | Freq: Three times a day (TID) | ORAL | 0 refills | Status: AC | PRN

## 2017-02-22 NOTE — Discharge Instructions (Signed)
Call Monday for an appointment with your primary care doctor. Let them know that you were seen in the emergency room. Continue drinking fluids to stay hydrated. A prescription for Zofran was written so that you can place on your tongue if any problems with nausea. Begin taking Levaquin once a day. Take this medication with you to your doctor's appointment. Tessalon as needed for coughing. This medication is one every 8 hours and does not cause any drowsiness.

## 2017-02-22 NOTE — ED Notes (Signed)
Pt states that she has had urinary frequency, dry cough, vomiting, and headache for the past 3-4 days. Pt states that she had similar symptoms a few years ago when she had a 10 mm kidney stone. Pt denies any flank pain at this time. Pt denies burning with urination but states that her urine is "warm".

## 2017-02-22 NOTE — ED Notes (Signed)
Pt unable to provide urine sample at this time 

## 2017-02-22 NOTE — ED Provider Notes (Signed)
[facility] Emergency Department Provider Note  ___________________________________________   First MD Initiated Contact with Patient 02/22/17 1036     (approximate)  I have reviewed the triage vital signs and the nursing notes.   HISTORY  Chief Complaint Urinary Frequency   HPI Yvette Woodward is a 71 y.o. female is here complaining of multiple complaints. Patient states that she has been coughing so hard that last week that she has been vomiting. She also is concerned that he may have a urinary tract infection or a kidney stone. She denies any flank pain but states that usually she starts coughing when she has a kidney stone. She also complains of nasal congestion. She has not been taking any over-the-counter medication for this. Patient last vomited just prior to arrival in the ED. She has drank very little liquids and last 24 hours.   Past Medical History:  Diagnosis Date  . Kidney stone   . Renal disorder    kidney stones    There are no active problems to display for this patient.   Past Surgical History:  Procedure Laterality Date  . LITHOTRIPSY    . STOMACH SURGERY      Prior to Admission medications   Medication Sig Start Date End Date Taking? Authorizing Provider  benzonatate (TESSALON PERLES) 100 MG capsule Take 1 capsule (100 mg total) by mouth 3 (three) times daily as needed for cough. 02/22/17 02/22/18  Tommi Rumps, PA-C  levofloxacin (LEVAQUIN) 500 MG tablet Take 1 tablet (500 mg total) by mouth daily. 02/22/17 03/04/17  Tommi Rumps, PA-C  ondansetron (ZOFRAN ODT) 4 MG disintegrating tablet Take 1 tablet (4 mg total) by mouth every 8 (eight) hours as needed for nausea or vomiting. 02/22/17   Tommi Rumps, PA-C  ondansetron (ZOFRAN) 4 MG tablet Take 1 tablet (4 mg total) by mouth daily as needed. 09/03/16   Myrna Blazer, MD    Allergies Erythromycin  No family history on file.  Social History Social History   Substance Use Topics  . Smoking status: Never Smoker  . Smokeless tobacco: Never Used  . Alcohol use Yes     Comment: occasionally    Review of Systems Constitutional: Denies fever/chills Eyes: No visual changes. ENT: No sore throat. Denies ear pain. Cardiovascular: Denies chest pain. Respiratory: Denies shortness of breath. Positive nonproductive cough. Gastrointestinal: No abdominal pain.  Positive nausea, positive vomiting.  No diarrhea.  Genitourinary: Reported positive for dysuria. Positive for history of UTIs and kidney stones. Musculoskeletal: Negative for back pain. Skin: Negative for rash. Neurological: Negative for headaches, focal weakness or numbness. ___________________________________________   PHYSICAL EXAM:  VITAL SIGNS: ED Triage Vitals  Enc Vitals Group     BP 02/22/17 1018 139/74     Pulse Rate 02/22/17 1018 97     Resp 02/22/17 1018 12     Temp 02/22/17 1018 98.6 F (37 C)     Temp Source 02/22/17 1018 Oral     SpO2 02/22/17 1018 95 %     Weight 02/22/17 1019 130 lb (59 kg)     Height 02/22/17 1019  (1.6 m)     Head Circumference --      Peak Flow --      Pain Score --      Pain Loc --      Pain Edu? --      Excl. in GC? --    Constitutional: Alert and oriented. Well appearing and  in no acute distress.Patient answers questions appropriately but is hard of hearing. Eyes: Conjunctivae are normal.  Head: Atraumatic. Nose: Minimal congestion/rhinnorhea. EACs and TMs are clear bilaterally. Mouth/Throat: Mucous membranes are moist.  Oropharynx non-erythematous. Neck: No stridor.   Hematological/Lymphatic/Immunilogical: No cervical lymphadenopathy. Cardiovascular: Normal rate, regular rhythm. Grossly normal heart sounds.  Good peripheral circulation. Respiratory: Normal respiratory effort.  No retractions. Lungs CTAB. Gastrointestinal: Soft and nontender. No distention. Bowel sounds normoactive 4 quadrants. No CVA tenderness. Musculoskeletal:  Moves upper and lower extremities without any difficulty and patient is noted to be ambulatory without assistance. Neurologic:  Normal speech and language. No gross focal neurologic deficits are appreciated. No gait instability. Skin:  Skin is warm, dry and intact. No rash noted. Psychiatric: Mood and affect are normal. Speech and behavior are normal.  ____________________________________________   LABS (all labs ordered are listed, but only abnormal results are displayed)  Labs Reviewed  URINALYSIS, COMPLETE (UACMP) WITH MICROSCOPIC - Abnormal; Notable for the following:       Result Value   Color, Urine AMBER (*)    APPearance HAZY (*)    Glucose, UA 50 (*)    Hgb urine dipstick MODERATE (*)    Ketones, ur 20 (*)    Protein, ur 100 (*)    Squamous Epithelial / LPF 0-5 (*)    All other components within normal limits  BASIC METABOLIC PANEL - Abnormal; Notable for the following:    Chloride 100 (*)    Glucose, Bld 138 (*)    All other components within normal limits  CBC WITH DIFFERENTIAL/PLATELET - Abnormal; Notable for the following:    WBC 20.2 (*)    Neutro Abs 18.3 (*)    Lymphs Abs 0.8 (*)    Monocytes Absolute 1.0 (*)    All other components within normal limits    RADIOLOGY  Dg Chest 2 View  Result Date: 02/22/2017 CLINICAL DATA:  71 year old female with cough EXAM: CHEST  2 VIEW COMPARISON:  Prior chest x-ray 08/07/2014 FINDINGS: The lungs are clear and negative for focal airspace consolidation, pulmonary edema or suspicious pulmonary nodule. No pleural effusion or pneumothorax. Cardiac and mediastinal contours are within normal limits. No acute fracture or lytic or blastic osseous lesions. The visualized upper abdominal bowel gas pattern is unremarkable. Surgical clips present in the region of the GE junction. 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 Left glenohumeral joint  osteoarthritis. IMPRESSION: No active cardiopulmonary disease. Electronically Signed   By: Malachy Moan M.D.   On: 02/22/2017 15:11   Ct Renal Stone Study  Result Date: 02/22/2017 CLINICAL DATA:  Hematuria.  Urinary frequency. EXAM: CT ABDOMEN AND PELVIS WITHOUT CONTRAST TECHNIQUE: Multidetector CT imaging of the abdomen and pelvis was performed following the standard protocol without IV contrast. COMPARISON:  CT scan of August 10, 2014. FINDINGS: Lower chest: 13 mm irregular density is noted posteriorly in the visualized portion of the right lung base which may represent focal inflammation, but neoplasm cannot be excluded. Hepatobiliary: No focal liver abnormality is seen. No gallstones, gallbladder wall thickening, or biliary dilatation. Pancreas: Unremarkable. No pancreatic ductal dilatation or surrounding inflammatory changes. Spleen: Normal in size without focal abnormality. Adrenals/Urinary Tract: Adrenal glands appear normal. Right kidney appears normal. Nonobstructive calculus is seen in lower pole of left kidney. Left renal cortical scarring is noted. No hydronephrosis or renal obstruction is noted. Urinary bladder is decompressed. Stomach/Bowel: Status post gastric surgery.  The appendix appears normal. Sigmoid diverticulosis is noted without inflammation. There is no evidence of bowel obstruction or inflammation. Vascular/Lymphatic: Aortic atherosclerosis. No enlarged abdominal or pelvic lymph nodes. Reproductive: Uterus and bilateral adnexa are unremarkable. Other: No abdominal wall hernia or abnormality. No abdominopelvic ascites. Musculoskeletal: No acute or significant osseous findings. IMPRESSION: 13 mm irregular density noted posteriorly in visualized portion of right lung base; this may simply represent inflammation, but neoplasm cannot be excluded. CT scan of the chest is recommended for further evaluation. Nonobstructive left renal calculus is noted. No hydronephrosis or renal  obstruction is noted. Sigmoid diverticulosis is noted without inflammation. Aortic atherosclerosis. Electronically Signed   By: Lupita RaiderJames  Green Jr, M.D.   On: 02/22/2017 15:36    ____________________________________________   PROCEDURES  Procedure(s) performed: None  Procedures  Critical Care performed: No  ____________________________________________   INITIAL IMPRESSION / ASSESSMENT AND PLAN / ED COURSE  Pertinent labs & imaging results that were available during my care of the patient were reviewed by me and considered in my medical decision making (see chart for details).  Prior discharge patient voiced that she feels much better after having fluids. There was no continued vomiting. We discussed findings on her chest x-ray and that she does have a renal stone measuring 13 mm that is nonobstructing. She is instructed to follow-up with her PCP in the coming week. There is an area of concern that was seen on her CT right lower lobe questioning infection versus mass. Considering patient's history of cough, congestion and nausea at this time she is being treated for pneumonia. She is instructed to stay hydrated. She is given a prescription for Tessalon Perles one every 8 hours as needed for cough, Zofran if needed for nausea, and Levaquin 1 daily. She'll follow up with her PCP and further in investigation of the questionable lung area if she is not responding to antibiotics.   ___________________________________________   FINAL CLINICAL IMPRESSION(S) / ED DIAGNOSES  Final diagnoses:  Pneumonia of right lower lobe due to infectious organism (HCC)      NEW MEDICATIONS STARTED DURING THIS VISIT:  New Prescriptions   BENZONATATE (TESSALON PERLES) 100 MG CAPSULE    Take 1 capsule (100 mg total) by mouth 3 (three) times daily as needed for cough.   LEVOFLOXACIN (LEVAQUIN) 500 MG TABLET    Take 1 tablet (500 mg total) by mouth daily.   ONDANSETRON (ZOFRAN ODT) 4 MG DISINTEGRATING TABLET     Take 1 tablet (4 mg total) by mouth every 8 (eight) hours as needed for nausea or vomiting.     Note:  This document was prepared using Dragon voice recognition software and may include unintentional dictation errors.    Tommi RumpsSummers, Cordella Nyquist L, PA-C 02/22/17 1613    Loleta RoseForbach, Cory, MD 02/22/17 [phone]

## 2017-02-22 NOTE — ED Notes (Addendum)
Pt alert and oriented X4, active, cooperative, pt in NAD. RR even and unlabored, color WNL.  Pt informed to return if any life threatening symptoms occur.  Pt verbalizes discharge instructions and followup care reviewed.

## 2017-02-22 NOTE — ED Triage Notes (Signed)
Patient states "I think I may have kidney stones. It's the same type of cough with kidney stones, not a normal cough.". Patient also reports "tinnitus" and nasal congestion. Patient ambulatory to triage without difficulty. Patient in no acute distress and able to speak in complete sentences without difficulty.

## 2017-02-22 NOTE — ED Triage Notes (Signed)
Pt coughing until she throws up X1 week. Cough preceeds emesis. Pt thinks she has kidney stone due to her coughing. Urinary frequency and warm feeling when urinating. Pt alert and oriented X4, active, cooperative, pt in NAD. RR even and unlabored, color WNL.

## 2017-02-26 ENCOUNTER — Encounter: Payer: Self-pay | Admitting: Family Medicine

## 2017-02-26 ENCOUNTER — Ambulatory Visit (INDEPENDENT_AMBULATORY_CARE_PROVIDER_SITE_OTHER): Payer: Medicare Other | Admitting: Family Medicine

## 2017-02-26 VITALS — BP 128/70 | HR 100 | Temp 97.7°F | Resp 16 | Ht 63.0 in | Wt 127.0 lb

## 2017-02-26 DIAGNOSIS — J189 Pneumonia, unspecified organism: Secondary | ICD-10-CM

## 2017-02-26 DIAGNOSIS — K5790 Diverticulosis of intestine, part unspecified, without perforation or abscess without bleeding: Secondary | ICD-10-CM | POA: Insufficient documentation

## 2017-02-26 DIAGNOSIS — R05 Cough: Secondary | ICD-10-CM | POA: Diagnosis not present

## 2017-02-26 DIAGNOSIS — R0602 Shortness of breath: Secondary | ICD-10-CM

## 2017-02-26 DIAGNOSIS — R059 Cough, unspecified: Secondary | ICD-10-CM

## 2017-02-26 NOTE — Progress Notes (Addendum)
Name: Yvette Woodward   MRN: [id number]    DOB: 05-28-1946   Date:02/26/2017       Progress Note  Subjective  Chief Complaint  Chief Complaint  Patient presents with  . Follow-up    ER for dehydration, cough    HPI  Patient is new to me today, and has not been seen by her PCP, Dr. Sanda Klein in at least 2 years.  She was seen in the ER on 02/22/2017 and was diagnosed with CAP - given 7 days of Levaquin and cough medications. CT Renal study results from radiologist recommended follow up CT scan of the chest for further evaluation and this was not performed in the ER - patient was presumptively treated as PNA due to patient's presenting symptoms. CT also revealed non-obstructing LEFT renal calculus.   She started feeling badly 02/20/2017, and presented to ER with right mid to upper back pain and cough.  Still having some mild back discomfort, slept through the night last night without coughing - cough is much improved today as well.  Also having periods of time where she has a hard time taking a deep breath and feels mildly short of breath.  Denies nausea - she used Zofran twice and this helped; still having decreased appetite.  She is a former smoker - stopped in the 60's - smoked 5-8 years always social only.  Patient Active Problem List   Diagnosis Date Noted  . Diverticulosis 02/26/2017  . Nephrolithiasis 02/22/2017    Past Surgical History:  Procedure Laterality Date  . LITHOTRIPSY    . STOMACH SURGERY      No family history on file.  Social History   Social History  . Marital status: Single    Spouse name: N/A  . Number of children: N/A  . Years of education: N/A   Occupational History  . Not on file.   Social History Main Topics  . Smoking status: Never Smoker  . Smokeless tobacco: Never Used  . Alcohol use Yes     Comment: occasionally  . Drug use: Unknown  . Sexual activity: Not on file   Other Topics Concern  . Not on file   Social History Narrative  . No  narrative on file     Current Outpatient Prescriptions:  .  benzonatate (TESSALON PERLES) 100 MG capsule, Take 1 capsule (100 mg total) by mouth 3 (three) times daily as needed for cough., Disp: 30 capsule, Rfl: 0 .  levofloxacin (LEVAQUIN) 500 MG tablet, Take 1 tablet (500 mg total) by mouth daily., Disp: 7 tablet, Rfl: 0 .  ondansetron (ZOFRAN ODT) 4 MG disintegrating tablet, Take 1 tablet (4 mg total) by mouth every 8 (eight) hours as needed for nausea or vomiting., Disp: 15 tablet, Rfl: 0 .  ondansetron (ZOFRAN) 4 MG tablet, Take 1 tablet (4 mg total) by mouth daily as needed., Disp: 10 tablet, Rfl: 0 .  latanoprost (XALATAN) 0.005 % ophthalmic solution, , Disp: , Rfl:   Allergies  Allergen Reactions  . Erythromycin Rash     ROS  Constitutional: Negative for fever or weight change.  Respiratory: Negative for cough and shortness of breath.   Cardiovascular: Negative for chest pain or palpitations.  Gastrointestinal: Negative for abdominal pain, no bowel changes.  Musculoskeletal: Negative for gait problem or joint swelling.  Skin: Negative for rash.  Neurological: Negative for dizziness or headache.  No other specific complaints in a complete review of systems (except as listed in HPI above).  Objective  Vitals:   02/26/17 0921  BP: 128/70  Pulse: 100  Resp: 16  Temp: 97.7 F (36.5 C)  TempSrc: Oral  SpO2: 96%  Weight: 127 lb (57.6 kg)  Height: '5\' 3"'  (1.6 m)   Body mass index is 22.5 kg/m.  Physical Exam  Constitutional: Patient appears well-developed and well-nourished.  No distress.  HEENT: head atraumatic, normocephalic, neck supple, throat within normal limits. OP pink and moist Cardiovascular: Normal rate, regular rhythm and normal heart sounds.  No murmur heard. No BLE edema. Pulmonary/Chest: Effort normal and breath sounds clear, but RLL is slightly diminished. No respiratory distress. Abdominal: Soft.  There is no tenderness. Psychiatric: Patient has a  normal mood and affect. behavior is normal. Judgment and thought content normal.  Recent Results (from the past 2160 hour(s))  Urinalysis, Complete w Microscopic     Status: Abnormal   Collection Time: 02/22/17 12:12 PM  Result Value Ref Range   Color, Urine AMBER (A) YELLOW    Comment: BIOCHEMICALS MAY BE AFFECTED BY COLOR   APPearance HAZY (A) CLEAR   Specific Gravity, Urine 1.014 1.005 - 1.030   pH 5.0 5.0 - 8.0   Glucose, UA 50 (A) NEGATIVE mg/dL   Hgb urine dipstick MODERATE (A) NEGATIVE   Bilirubin Urine NEGATIVE NEGATIVE   Ketones, ur 20 (A) NEGATIVE mg/dL   Protein, ur 100 (A) NEGATIVE mg/dL   Nitrite NEGATIVE NEGATIVE   Leukocytes, UA NEGATIVE NEGATIVE   RBC / HPF 6-30 0 - 5 RBC/hpf   WBC, UA 0-5 0 - 5 WBC/hpf   Bacteria, UA NONE SEEN NONE SEEN   Squamous Epithelial / LPF 0-5 (A) NONE SEEN   Mucus PRESENT    Hyaline Casts, UA PRESENT   Basic metabolic panel     Status: Abnormal   Collection Time: 02/22/17 12:12 PM  Result Value Ref Range   Sodium 137 135 - 145 mmol/L   Potassium 3.5 3.5 - 5.1 mmol/L   Chloride 100 (L) 101 - 111 mmol/L   CO2 24 22 - 32 mmol/L   Glucose, Bld 138 (H) 65 - 99 mg/dL   BUN 13 6 - 20 mg/dL   Creatinine, Ser 0.90 0.44 - 1.00 mg/dL   Calcium 9.5 8.9 - 10.3 mg/dL   GFR calc non Af Amer >60 >60 mL/min   GFR calc Af Amer >60 >60 mL/min    Comment: (NOTE) The eGFR has been calculated using the CKD EPI equation. This calculation has not been validated in all clinical situations. eGFR's persistently <60 mL/min signify possible Chronic Kidney Disease.    Anion gap 13 5 - 15  CBC with Differential     Status: Abnormal   Collection Time: 02/22/17 12:12 PM  Result Value Ref Range   WBC 20.2 (H) 3.6 - 11.0 K/uL   RBC 4.82 3.80 - 5.20 MIL/uL   Hemoglobin 15.0 12.0 - 16.0 g/dL   HCT 43.8 35.0 - 47.0 %   MCV 90.9 80.0 - 100.0 fL   MCH 31.2 26.0 - 34.0 pg   MCHC 34.4 32.0 - 36.0 g/dL   RDW 12.7 11.5 - 14.5 %   Platelets 202 150 - 440 K/uL    Neutrophils Relative % 91 %   Neutro Abs 18.3 (H) 1.4 - 6.5 K/uL   Lymphocytes Relative 4 %   Lymphs Abs 0.8 (L) 1.0 - 3.6 K/uL   Monocytes Relative 5 %   Monocytes Absolute 1.0 (H) 0.2 - 0.9 K/uL   Eosinophils Relative  0 %   Eosinophils Absolute 0.0 0 - 0.7 K/uL   Basophils Relative 0 %   Basophils Absolute 0.0 0 - 0.1 K/uL   PHQ2/9: Depression screen [facility] 2/9 02/26/2017  Decreased Interest 0  Down, Depressed, Hopeless 0  PHQ - 2 Score 0   Fall Risk: Fall Risk  02/26/2017  Falls in the past year? Yes  Number falls in past yr: 1  Injury with Fall? No  Risk for fall due to : (No Data)  Risk for fall due to: Comment 1 fall this year   Assessment & Plan  1. Community acquired pneumonia of right lung, unspecified part of lung Finish Levaquin. Follow up with PCP Dr. Sanda Klein in 3-4 weeks.  2. Cough Tessalon PRN  3. Shortness of breath Finish Levaquin. Follow up with PCP Dr. Sanda Klein in 3-4 weeks.  Paperwork to keep patient out of work until 03/03/2017 is completed and faxed for her job at [employer] to allow patient to recover from her illness.  She will call the office if she is not improving or feeling worse, and we will have her return to the clinic and consider extending her time out of work.  -Red flags and when to present for emergency care or RTC including fever >101.57F, chest pain, worsening shortness of breath, new/worsening/un-resolving symptoms, reviewed with patient at time of visit. Follow up and care instructions discussed and provided in AVS.  I have reviewed this encounter including the documentation in this note and/or discussed this patient with the Johney Maine, FNP, NP-C. I am certifying that I agree with the content of this note as supervising physician.  Steele Sizer, MD [facility] 03/01/2017, 8:20 PM

## 2017-03-06 ENCOUNTER — Telehealth: Payer: Self-pay | Admitting: Family Medicine

## 2017-03-06 NOTE — Telephone Encounter (Signed)
Delice Bison from [employer] filled out a form and need clarification. Would like to know if Irving Burton not supporting the patient time off work. Please return call  (P) [phone] option 1 (F) [phone]

## 2017-03-07 ENCOUNTER — Telehealth: Payer: Self-pay | Admitting: Family Medicine

## 2017-03-07 NOTE — Telephone Encounter (Signed)
Paperwork faxed and scan into chart

## 2017-03-07 NOTE — Telephone Encounter (Signed)
Spoke with [employer], Onalee Hua, and advised that I do support patient's time off up until 03/03/2017 to allow time to complete antibiotics and recover from her symptoms which included shortness of breath and fatigue.  I am awaiting a call back from case manager Daphney to discuss further.

## 2017-03-07 NOTE — Telephone Encounter (Signed)
Pt will be re-faxing her FMLA paperwork. It is a question on there about is she able to work. she is asking that you please YES and the date. She went out of work on a Saturday 02/22/17-03/02/17 (went to ER on the 8th). Just initial the change. Please leave pt a voice mail letting her know that everything went through  [phone]

## 2017-03-07 NOTE — Telephone Encounter (Signed)
Paperwork faxed and scan into chart 

## 2017-03-07 NOTE — Telephone Encounter (Signed)
Spoke with the [employer] and received paperwork faxed to our office. Paperwork is completed.  Please fax back and call patient to notify. Thanks!

## 2017-03-25 ENCOUNTER — Ambulatory Visit: Payer: Medicare Other | Admitting: Family Medicine

## 2017-11-11 ENCOUNTER — Ambulatory Visit: Payer: Medicare Other | Admitting: Nurse Practitioner

## 2017-12-25 DIAGNOSIS — H401131 Primary open-angle glaucoma, bilateral, mild stage: Secondary | ICD-10-CM | POA: Diagnosis not present

## 2017-12-30 DIAGNOSIS — H401131 Primary open-angle glaucoma, bilateral, mild stage: Secondary | ICD-10-CM | POA: Diagnosis not present

## 2018-02-06 DIAGNOSIS — H401131 Primary open-angle glaucoma, bilateral, mild stage: Secondary | ICD-10-CM | POA: Diagnosis not present

## 2018-10-15 IMAGING — CR DG CHEST 2V
1 series · 2 of 2 positions shown · non-contrast
Comparison: Prior chest x-ray 08/07/2014

CLINICAL DATA: 71-year-old female with cough

EXAM:
CHEST  2 VIEW

[Series 1: dg chest 2 view · 0.14mm/px · 2 of 2 slices shown]
[im 1/2]
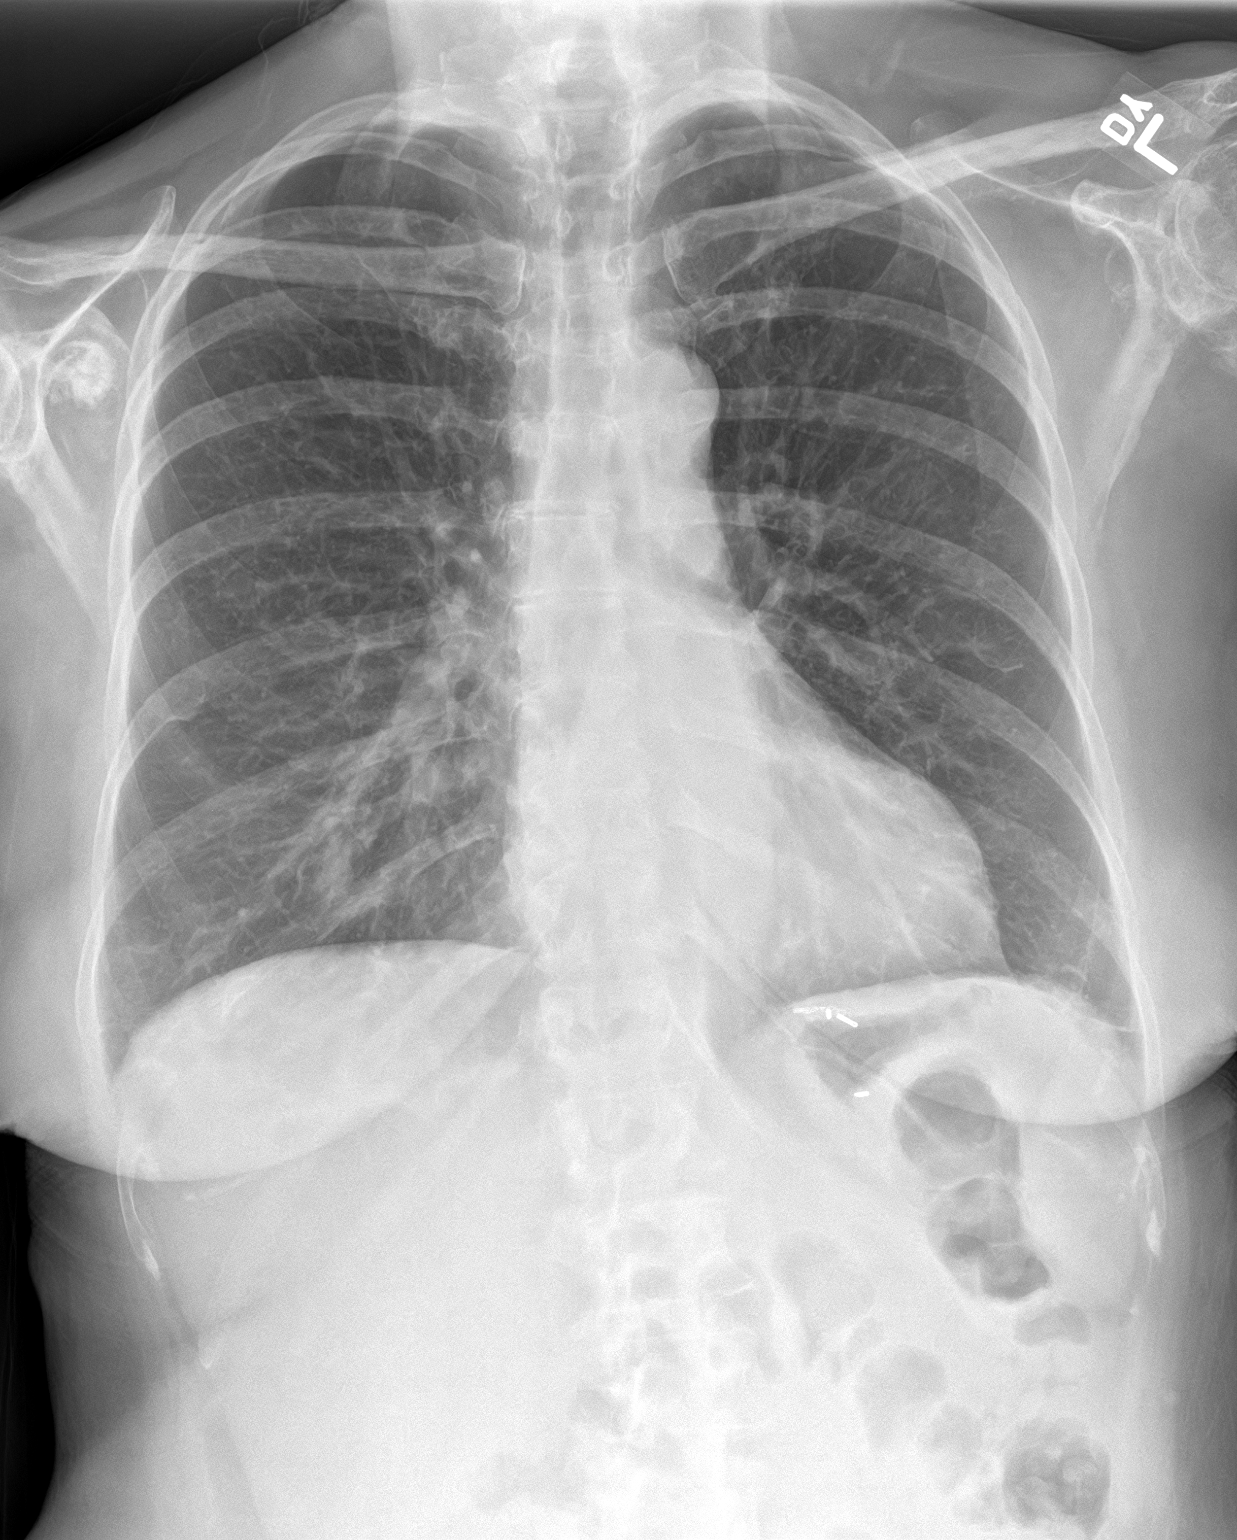
[im 2/2]
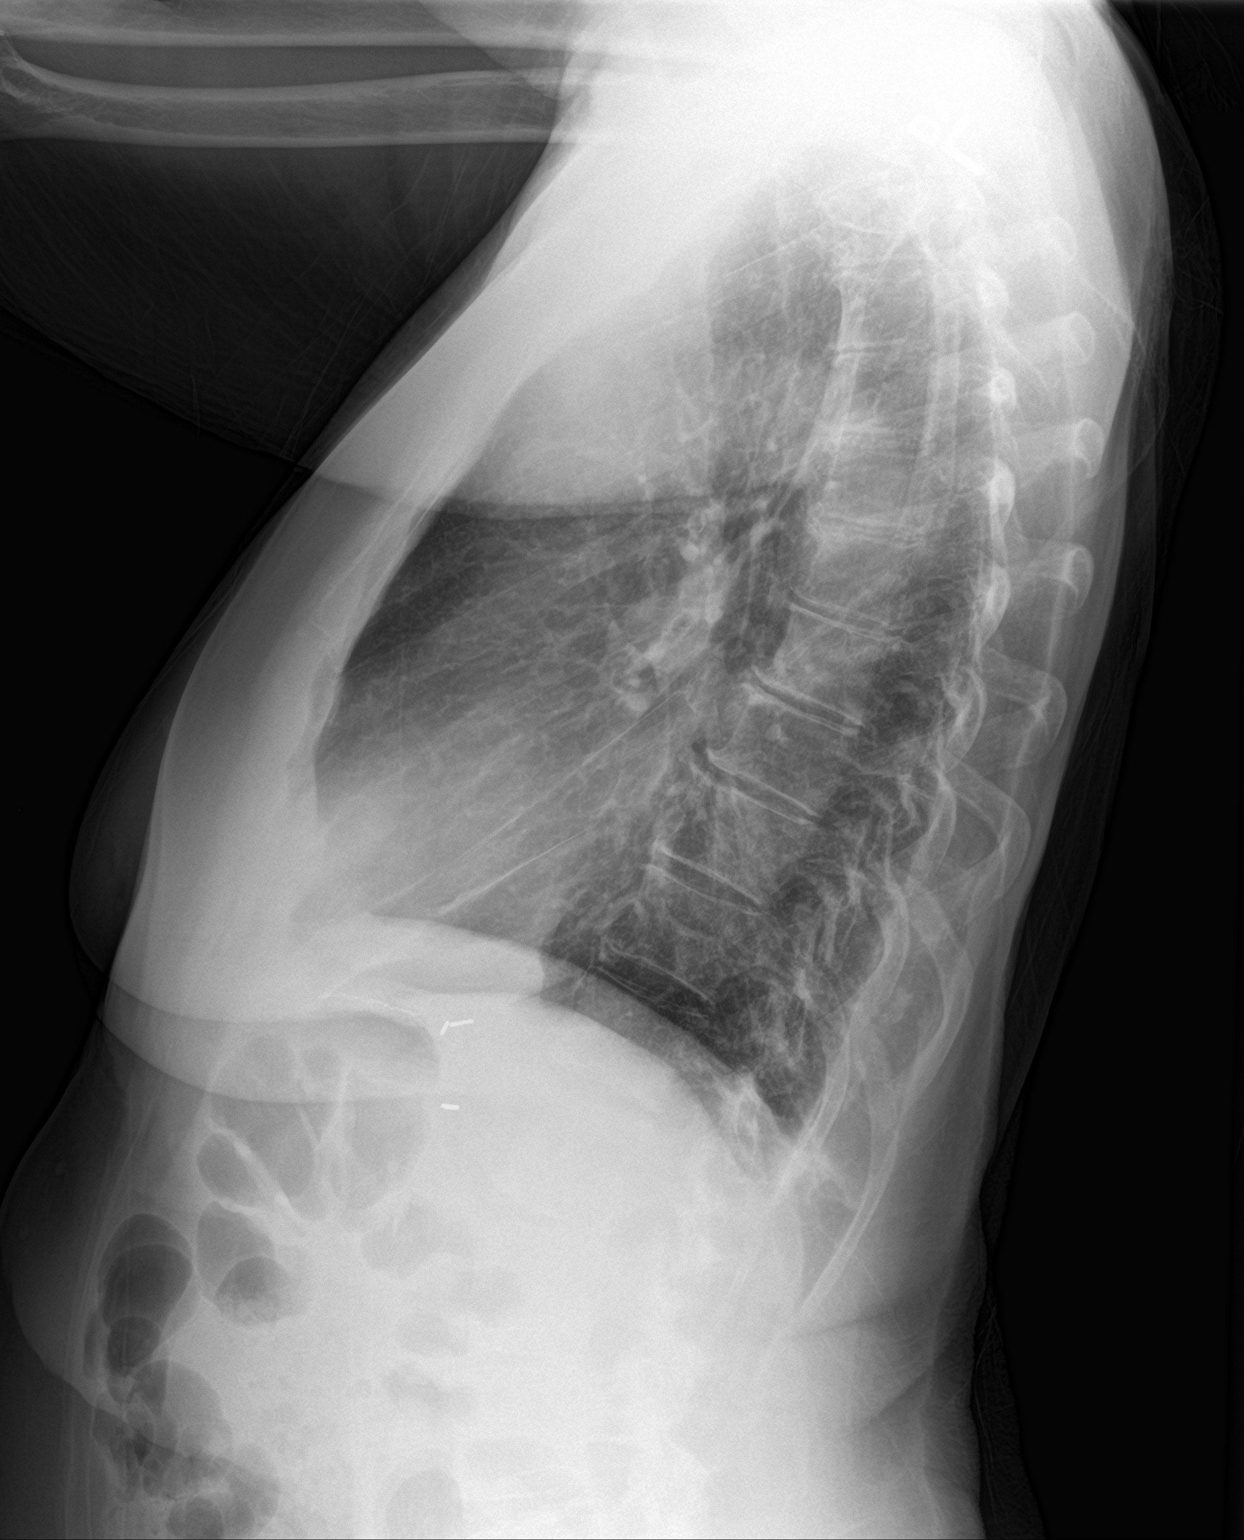

[2 of 2 positions shown; findings below may reference images not displayed]

FINDINGS: The lungs are clear and negative for focal airspace consolidation,
pulmonary edema or suspicious pulmonary nodule. No pleural effusion
or pneumothorax. Cardiac and mediastinal contours are within normal
limits. No acute fracture or lytic or blastic osseous lesions. The
visualized upper abdominal bowel gas pattern is unremarkable.
Surgical clips present in the region of the GE junction. Stable
flocculent ossification in the region of the right scapular coracoid
process appears unchanged dating back to Friday July, 2014. Left
glenohumeral joint osteoarthritis.
IMPRESSION: No active cardiopulmonary disease.

## 2019-03-03 DIAGNOSIS — H401131 Primary open-angle glaucoma, bilateral, mild stage: Secondary | ICD-10-CM | POA: Diagnosis not present

## 2023-03-12 ENCOUNTER — Emergency Department: Payer: Medicare Other

## 2023-03-12 ENCOUNTER — Other Ambulatory Visit: Payer: Self-pay

## 2023-03-12 ENCOUNTER — Inpatient Hospital Stay: Payer: Medicare Other

## 2023-03-12 ENCOUNTER — Inpatient Hospital Stay
Admission: EM | Admit: 2023-03-12 | Discharge: 2023-03-16 | DRG: 871 | Disposition: A | Discharge status: Home Health | Payer: Medicare Other | Attending: Internal Medicine | Admitting: Internal Medicine

## 2023-03-12 DIAGNOSIS — D696 Thrombocytopenia, unspecified: Secondary | ICD-10-CM | POA: Insufficient documentation

## 2023-03-12 DIAGNOSIS — F419 Anxiety disorder, unspecified: Secondary | ICD-10-CM | POA: Diagnosis present

## 2023-03-12 DIAGNOSIS — K029 Dental caries, unspecified: Secondary | ICD-10-CM | POA: Diagnosis present

## 2023-03-12 DIAGNOSIS — J320 Chronic maxillary sinusitis: Secondary | ICD-10-CM | POA: Diagnosis present

## 2023-03-12 DIAGNOSIS — H409 Unspecified glaucoma: Secondary | ICD-10-CM | POA: Diagnosis present

## 2023-03-12 DIAGNOSIS — R6521 Severe sepsis with septic shock: Secondary | ICD-10-CM | POA: Diagnosis present

## 2023-03-12 DIAGNOSIS — N3080 Other cystitis without hematuria: Secondary | ICD-10-CM | POA: Diagnosis not present

## 2023-03-12 DIAGNOSIS — R54 Age-related physical debility: Secondary | ICD-10-CM | POA: Diagnosis present

## 2023-03-12 DIAGNOSIS — K047 Periapical abscess without sinus: Secondary | ICD-10-CM

## 2023-03-12 DIAGNOSIS — I4892 Unspecified atrial flutter: Secondary | ICD-10-CM | POA: Diagnosis not present

## 2023-03-12 DIAGNOSIS — I480 Paroxysmal atrial fibrillation: Secondary | ICD-10-CM | POA: Diagnosis not present

## 2023-03-12 DIAGNOSIS — N200 Calculus of kidney: Secondary | ICD-10-CM | POA: Diagnosis present

## 2023-03-12 DIAGNOSIS — B9620 Unspecified Escherichia coli [E. coli] as the cause of diseases classified elsewhere: Secondary | ICD-10-CM | POA: Diagnosis present

## 2023-03-12 DIAGNOSIS — A419 Sepsis, unspecified organism: Secondary | ICD-10-CM | POA: Diagnosis not present

## 2023-03-12 DIAGNOSIS — Z1624 Resistance to multiple antibiotics: Secondary | ICD-10-CM | POA: Diagnosis present

## 2023-03-12 DIAGNOSIS — E440 Moderate protein-calorie malnutrition: Secondary | ICD-10-CM

## 2023-03-12 DIAGNOSIS — Z7901 Long term (current) use of anticoagulants: Secondary | ICD-10-CM

## 2023-03-12 DIAGNOSIS — Z1152 Encounter for screening for COVID-19: Secondary | ICD-10-CM

## 2023-03-12 DIAGNOSIS — Z87448 Personal history of other diseases of urinary system: Secondary | ICD-10-CM

## 2023-03-12 DIAGNOSIS — M25511 Pain in right shoulder: Secondary | ICD-10-CM | POA: Diagnosis present

## 2023-03-12 DIAGNOSIS — K0889 Other specified disorders of teeth and supporting structures: Secondary | ICD-10-CM | POA: Diagnosis present

## 2023-03-12 DIAGNOSIS — M869 Osteomyelitis, unspecified: Secondary | ICD-10-CM | POA: Diagnosis present

## 2023-03-12 DIAGNOSIS — N390 Urinary tract infection, site not specified: Secondary | ICD-10-CM

## 2023-03-12 DIAGNOSIS — I4891 Unspecified atrial fibrillation: Secondary | ICD-10-CM | POA: Diagnosis not present

## 2023-03-12 DIAGNOSIS — Z87442 Personal history of urinary calculi: Secondary | ICD-10-CM

## 2023-03-12 DIAGNOSIS — N179 Acute kidney failure, unspecified: Secondary | ICD-10-CM | POA: Diagnosis present

## 2023-03-12 DIAGNOSIS — E538 Deficiency of other specified B group vitamins: Secondary | ICD-10-CM | POA: Insufficient documentation

## 2023-03-12 DIAGNOSIS — Z9889 Other specified postprocedural states: Secondary | ICD-10-CM

## 2023-03-12 DIAGNOSIS — H9319 Tinnitus, unspecified ear: Secondary | ICD-10-CM | POA: Diagnosis present

## 2023-03-12 DIAGNOSIS — E872 Acidosis, unspecified: Secondary | ICD-10-CM | POA: Diagnosis present

## 2023-03-12 DIAGNOSIS — Z6823 Body mass index (BMI) 23.0-23.9, adult: Secondary | ICD-10-CM

## 2023-03-12 DIAGNOSIS — M25512 Pain in left shoulder: Secondary | ICD-10-CM | POA: Diagnosis present

## 2023-03-12 DIAGNOSIS — Z881 Allergy status to other antibiotic agents status: Secondary | ICD-10-CM

## 2023-03-12 DIAGNOSIS — K5790 Diverticulosis of intestine, part unspecified, without perforation or abscess without bleeding: Secondary | ICD-10-CM | POA: Diagnosis present

## 2023-03-12 LAB — CBC WITH DIFFERENTIAL/PLATELET
Abs Immature Granulocytes: 1.67 10*3/uL — ABNORMAL HIGH (ref 0.00–0.07)
Basophils Absolute: 0.2 10*3/uL — ABNORMAL HIGH (ref 0.0–0.1)
Basophils Relative: 0 %
Eosinophils Absolute: 0.1 10*3/uL (ref 0.0–0.5)
Eosinophils Relative: 0 %
HCT: 40.6 % (ref 36.0–46.0)
Hemoglobin: 13.6 g/dL (ref 12.0–15.0)
Immature Granulocytes: 4 %
Lymphocytes Relative: 2 %
Lymphs Abs: 0.9 10*3/uL (ref 0.7–4.0)
MCH: 31.5 pg (ref 26.0–34.0)
MCHC: 33.5 g/dL (ref 30.0–36.0)
MCV: 94 fL (ref 80.0–100.0)
Monocytes Absolute: 2 10*3/uL — ABNORMAL HIGH (ref 0.1–1.0)
Monocytes Relative: 4 %
Neutro Abs: 40 10*3/uL — ABNORMAL HIGH (ref 1.7–7.7)
Neutrophils Relative %: 90 %
Platelets: 198 10*3/uL (ref 150–400)
RBC: 4.32 MIL/uL (ref 3.87–5.11)
RDW: 13.3 % (ref 11.5–15.5)
Smear Review: NORMAL
WBC Morphology: INCREASED
WBC: 44.8 10*3/uL — ABNORMAL HIGH (ref 4.0–10.5)
nRBC: 0 % (ref 0.0–0.2)

## 2023-03-12 LAB — BASIC METABOLIC PANEL
Anion gap: 18 — ABNORMAL HIGH (ref 5–15)
BUN: 30 mg/dL — ABNORMAL HIGH (ref 8–23)
CO2: 15 mmol/L — ABNORMAL LOW (ref 22–32)
Calcium: 8.7 mg/dL — ABNORMAL LOW (ref 8.9–10.3)
Chloride: 104 mmol/L (ref 98–111)
Creatinine, Ser: 2.19 mg/dL — ABNORMAL HIGH (ref 0.44–1.00)
GFR, Estimated: 23 mL/min — ABNORMAL LOW (ref >60)
Glucose, Bld: 126 mg/dL — ABNORMAL HIGH (ref 70–99)
Potassium: 3.6 mmol/L (ref 3.5–5.1)
Sodium: 137 mmol/L (ref 135–145)

## 2023-03-12 LAB — URINALYSIS, W/ REFLEX TO CULTURE (INFECTION SUSPECTED)
Bilirubin Urine: NEGATIVE
Glucose, UA: NEGATIVE mg/dL
Ketones, ur: 5 mg/dL — AB
Nitrite: NEGATIVE
Protein, ur: 100 mg/dL — AB
RBC / HPF: >50 RBC/hpf (ref 0–5)
Specific Gravity, Urine: 1.021 (ref 1.005–1.030)
WBC, UA: >50 WBC/hpf (ref 0–5)
pH: 5 (ref 5.0–8.0)

## 2023-03-12 LAB — LACTIC ACID, PLASMA
Lactic Acid, Venous: 6 mmol/L (ref 0.5–1.9)
Lactic Acid, Venous: 2.2 mmol/L (ref 0.5–1.9)
Lactic Acid, Venous: 1.2 mmol/L (ref 0.5–1.9)

## 2023-03-12 LAB — TROPONIN I (HIGH SENSITIVITY)
Troponin I (High Sensitivity): 20 ng/L — ABNORMAL HIGH (ref <18)
Troponin I (High Sensitivity): 10 ng/L (ref <18)

## 2023-03-12 LAB — SARS CORONAVIRUS 2 BY RT PCR: SARS Coronavirus 2 by RT PCR: NEGATIVE

## 2023-03-12 LAB — PHOSPHORUS: Phosphorus: 3.4 mg/dL (ref 2.5–4.6)

## 2023-03-12 LAB — MAGNESIUM: Magnesium: 1.4 mg/dL — ABNORMAL LOW (ref 1.7–2.4)

## 2023-03-12 LAB — PROCALCITONIN: Procalcitonin: 40.14 ng/mL

## 2023-03-12 LAB — MRSA NEXT GEN BY PCR, NASAL: MRSA by PCR Next Gen: NOT DETECTED

## 2023-03-12 MED ORDER — HEPARIN SODIUM (PORCINE) 5000 UNIT/ML IJ SOLN
5000.0000 [IU] | Freq: Three times a day (TID) | INTRAMUSCULAR | Status: DC
  Administered 2023-03-12 – 2023-03-13 (×3): 5000 [IU] via SUBCUTANEOUS
  Filled 2023-03-12 (×3): qty 1

## 2023-03-12 MED ORDER — HYDROMORPHONE HCL 1 MG/ML IJ SOLN: 0.2500 mg | INTRAMUSCULAR | Status: DC | PRN

## 2023-03-12 MED ORDER — VANCOMYCIN HCL 1250 MG/250ML IV SOLN
1250.0000 mg | Freq: Once | INTRAVENOUS | Status: AC
  Administered 2023-03-12: 1250 mg via INTRAVENOUS
  Filled 2023-03-12: qty 250

## 2023-03-12 MED ORDER — ACETAMINOPHEN 325 MG PO TABS: 650.0000 mg | ORAL_TABLET | ORAL | Status: DC | PRN

## 2023-03-12 MED ORDER — SODIUM CHLORIDE 0.9 % IV BOLUS
1000.0000 mL | Freq: Once | INTRAVENOUS | Status: AC
  Administered 2023-03-12: 1000 mL via INTRAVENOUS

## 2023-03-12 MED ORDER — SODIUM CHLORIDE 0.9% FLUSH: 3.0000 mL | INTRAVENOUS | Status: DC | PRN

## 2023-03-12 MED ORDER — LACTATED RINGERS IV BOLUS
1000.0000 mL | Freq: Once | INTRAVENOUS | Status: AC
  Administered 2023-03-12: 1000 mL via INTRAVENOUS

## 2023-03-12 MED ORDER — NOREPINEPHRINE 4 MG/250ML-% IV SOLN
2.0000 ug/min | INTRAVENOUS | Status: DC
  Administered 2023-03-12: 2 ug/min via INTRAVENOUS
  Filled 2023-03-12: qty 250

## 2023-03-12 MED ORDER — SODIUM BICARBONATE 8.4 % IV SOLN
INTRAVENOUS | Status: DC
  Filled 2023-03-12: qty 1000
  Filled 2023-03-12: qty 150

## 2023-03-12 MED ORDER — PANTOPRAZOLE SODIUM 40 MG IV SOLR
40.0000 mg | INTRAVENOUS | Status: DC
  Administered 2023-03-12 – 2023-03-14 (×3): 40 mg via INTRAVENOUS
  Filled 2023-03-12 (×3): qty 10

## 2023-03-12 MED ORDER — ONDANSETRON HCL 4 MG/2ML IJ SOLN
4.0000 mg | Freq: Four times a day (QID) | INTRAMUSCULAR | Status: DC | PRN
  Administered 2023-03-13: 4 mg via INTRAVENOUS
  Filled 2023-03-12: qty 2

## 2023-03-12 MED ORDER — SODIUM CHLORIDE 0.9 % IV SOLN: 250.0000 mL | INTRAVENOUS | Status: DC | PRN

## 2023-03-12 MED ORDER — ONDANSETRON HCL 4 MG/2ML IJ SOLN
4.0000 mg | Freq: Once | INTRAMUSCULAR | Status: AC
  Administered 2023-03-12: 4 mg via INTRAVENOUS
  Filled 2023-03-12: qty 2

## 2023-03-12 MED ORDER — SODIUM CHLORIDE 0.9 % IV SOLN
1.0000 g | Freq: Once | INTRAVENOUS | Status: AC
  Administered 2023-03-12: 1 g via INTRAVENOUS
  Filled 2023-03-12: qty 20

## 2023-03-12 MED ORDER — SODIUM CHLORIDE 0.9 % IV SOLN
2.0000 g | INTRAVENOUS | Status: DC
  Administered 2023-03-12: 2 g via INTRAVENOUS
  Filled 2023-03-12: qty 20

## 2023-03-12 MED ORDER — SODIUM CHLORIDE 0.9 % IV SOLN
500.0000 mg | Freq: Two times a day (BID) | INTRAVENOUS | Status: DC
  Filled 2023-03-12: qty 10

## 2023-03-12 MED ORDER — POLYETHYLENE GLYCOL 3350 17 G PO PACK: 17.0000 g | PACK | Freq: Every day | ORAL | Status: DC | PRN

## 2023-03-12 MED ORDER — SODIUM CHLORIDE 0.9% FLUSH
3.0000 mL | Freq: Two times a day (BID) | INTRAVENOUS | Status: DC
  Administered 2023-03-12 – 2023-03-16 (×7): 3 mL via INTRAVENOUS

## 2023-03-12 MED ORDER — MORPHINE SULFATE (PF) 4 MG/ML IV SOLN
4.0000 mg | Freq: Once | INTRAVENOUS | Status: AC
  Administered 2023-03-12: 4 mg via INTRAVENOUS
  Filled 2023-03-12: qty 1

## 2023-03-12 MED ORDER — SODIUM CHLORIDE 0.9 % IV SOLN
250.0000 mL | INTRAVENOUS | Status: DC
  Administered 2023-03-12: 250 mL via INTRAVENOUS

## 2023-03-12 MED ORDER — MAGNESIUM SULFATE 2 GM/50ML IV SOLN
2.0000 g | Freq: Once | INTRAVENOUS | Status: AC
  Administered 2023-03-12: 2 g via INTRAVENOUS
  Filled 2023-03-12: qty 50

## 2023-03-12 MED ORDER — DOCUSATE SODIUM 100 MG PO CAPS: 100.0000 mg | ORAL_CAPSULE | Freq: Two times a day (BID) | ORAL | Status: DC | PRN

## 2023-03-12 MED ORDER — LACTATED RINGERS IV BOLUS (SEPSIS)
1000.0000 mL | Freq: Once | INTRAVENOUS | Status: AC
  Administered 2023-03-12: 1000 mL via INTRAVENOUS

## 2023-03-12 NOTE — ED Provider Notes (Signed)
[facility] Provider Note    Event Date/Time   First MD Initiated Contact with Patient 03/12/23 1506     (approximate)   History    HPI  Yvette Woodward is a 77 y.o. female with a history of kidney stones who presents with bilateral lower abdominal/flank pain over the last couple of days, persistent course, associated with nausea.  The patient denies any vomiting or diarrhea.  She has no urinary symptoms.  She has had several episodes over the last few months where she starts to feel a sensation as if her abdomen will start to hurt, but then she states it does not fully hurt.  She denies any fever or chills.  She has had no recent night sweats, fatigue, shortness of breath, or chest pain.  I reviewed the medical records.  The patient's most recent documented outpatient encounter was with family medicine in 2018 for follow-up after an ED visit.  She has no recent hospitalizations or ED admissions.  The patient states that she has not been to a doctor in the last several years and is not on any medications.   Physical Exam   Triage Vital Signs: ED Triage Vitals  Encounter Vitals Group     BP 03/12/23 1447 (!) 71/53     Systolic BP Percentile --      Diastolic BP Percentile --      Pulse Rate 03/12/23 1447 (!) 116     Resp 03/12/23 1447 16     Temp 03/12/23 1447 97.8 F (36.6 C)     Temp Source 03/12/23 1447 Oral     SpO2 03/12/23 1447 95 %     Weight --      Height 03/12/23 1448 5\' 3"  (1.6 m)     Head Circumference --      Peak Flow --      Pain Score 03/12/23 1447 10     Pain Loc --      Pain Education --      Exclude from Growth Chart --     Most recent vital signs: Vitals:   03/12/23 1730 03/12/23 1800  BP: (!) 79/54 (!) 91/59  Pulse: 94 86  Resp: 17 16  Temp:    SpO2: 95% 98%     General: Alert and oriented, frail appearing but in no distress.  CV:  Good peripheral perfusion.  Resp:  Normal effort.  Lungs CTAB. Abd:  Soft with  bilateral lower quadrant tenderness.  No distention.  Other:  No jaundice or scleral icterus.  Slightly dry mucous membranes.   ED Results / Procedures / Treatments   Labs (all labs ordered are listed, but only abnormal results are displayed) Labs Reviewed  CBC WITH DIFFERENTIAL/PLATELET - Abnormal; Notable for the following components:      Result Value   WBC 44.8 (*)    Neutro Abs 40.0 (*)    Monocytes Absolute 2.0 (*)    Basophils Absolute 0.2 (*)    Abs Immature Granulocytes 1.67 (*)    All other components within normal limits  BASIC METABOLIC PANEL - Abnormal; Notable for the following components:   CO2 15 (*)    Glucose, Bld 126 (*)    BUN 30 (*)    Creatinine, Ser 2.19 (*)    Calcium 8.7 (*)    GFR, Estimated 23 (*)    Anion gap 18 (*)    All other components within normal limits  LACTIC ACID, PLASMA - Abnormal;  Notable for the following components:   Lactic Acid, Venous 6.0 (*)    All other components within normal limits  LACTIC ACID, PLASMA - Abnormal; Notable for the following components:   Lactic Acid, Venous 2.2 (*)    All other components within normal limits  TROPONIN I (HIGH SENSITIVITY) - Abnormal; Notable for the following components:   Troponin I (High Sensitivity) 20 (*)    All other components within normal limits  CULTURE, BLOOD (ROUTINE X 2)  CULTURE, BLOOD (ROUTINE X 2)  URINALYSIS, ROUTINE W REFLEX MICROSCOPIC  URINALYSIS, W/ REFLEX TO CULTURE (INFECTION SUSPECTED)  PROCALCITONIN  CBC  RENAL FUNCTION PANEL  PROCALCITONIN  MAGNESIUM     EKG  ED ECG REPORT I, Dionne Bucy, the attending physician, personally viewed and interpreted this ECG.  Date: 03/12/2023 EKG Time: 1442 Rate: 119 Rhythm: Sinus tachycardia QRS Axis: normal Intervals: LAFB ST/T Wave abnormalities: Nonspecific abnormalities Narrative Interpretation: no evidence of acute ischemia    RADIOLOGY  Chest x-ray: I independently viewed and interpreted the images;  there is no focal consolidation or edema  CT abdomen/pelvis:  IMPRESSION:  1. Multifocal gaseous foci within the left renal collecting system  and urinary bladder, which may be related to gas-forming infection  given absent history of recent instrumentation. Recommend  correlation with urinalysis and urine cultures.  2. An 8 mm nonobstructing stone within the left renal pelvis.  3. Diverticulosis without acute diverticulitis.  4. Aortic Atherosclerosis (ICD10-I70.0). Coronary artery  calcifications. Assessment for potential risk factor modification,  dietary therapy or pharmacologic therapy may be warranted, if  clinically indicated.    These results were called by telephone at the time of interpretation  on 03/12/2023 at 4:58 pm to provider [facility] , who  verbally acknowledged these results.     PROCEDURES:  Critical Care performed: No  .Critical Care  Performed by: Dionne Bucy, MD Authorized by: Dionne Bucy, MD   Critical care provider statement:    Critical care time (minutes):  40   Critical care time was exclusive of:  Separately billable procedures and treating other patients   Critical care was necessary to treat or prevent imminent or life-threatening deterioration of the following conditions:  Sepsis   Critical care was time spent personally by me on the following activities:  Development of treatment plan with patient or surrogate, discussions with consultants, evaluation of patient's response to treatment, examination of patient, ordering and review of laboratory studies, ordering and review of radiographic studies, ordering and performing treatments and interventions, pulse oximetry, re-evaluation of patient's condition, review of old charts and obtaining history from patient or surrogate   Care discussed with: admitting provider      MEDICATIONS ORDERED IN ED: Medications  sodium chloride flush (NS) 0.9 % injection 3 mL (has no  administration in time range)  sodium chloride flush (NS) 0.9 % injection 3 mL (has no administration in time range)  0.9 %  sodium chloride infusion (has no administration in time range)  acetaminophen (TYLENOL) tablet 650 mg (has no administration in time range)  docusate sodium (COLACE) capsule 100 mg (has no administration in time range)  polyethylene glycol (MIRALAX / GLYCOLAX) packet 17 g (has no administration in time range)  ondansetron (ZOFRAN) injection 4 mg (has no administration in time range)  heparin injection 5,000 Units (has no administration in time range)  sodium bicarbonate 150 mEq in dextrose 5 % 1,150 mL infusion (has no administration in time range)  sodium chloride 0.9 % bolus  1,000 mL (0 mLs Intravenous Stopped 03/12/23 1540)  ondansetron (ZOFRAN) injection 4 mg (4 mg Intravenous Given 03/12/23 1550)  morphine (PF) 4 MG/ML injection 4 mg (4 mg Intravenous Given 03/12/23 1551)  lactated ringers bolus 1,000 mL (0 mLs Intravenous Stopped 03/12/23 1839)  lactated ringers bolus 1,000 mL (1,000 mLs Intravenous New Bag/Given 03/12/23 1844)     IMPRESSION / MDM / ASSESSMENT AND PLAN / ED COURSE  I reviewed the triage vital signs and the nursing notes.  77 year old female with PMH as noted above presents with lower abdominal and flank pain for the last couple of days associated today with nausea.  On exam she is hypotensive and tachycardic.  Other vital signs are normal.  Abdomen is soft with some bilateral lower quadrant tenderness.  Differential diagnosis includes, but is not limited to, acute infection/sepsis, possible pyelonephritis versus GI etiology, dehydration, AKI, electrolyte abnormality, cardiac etiology, malignancy.  We will give fluids, obtain lab workup, chest x-ray, CT abdomen/pelvis, and reassess.  Patient's presentation is most consistent with acute presentation with potential threat to life or bodily function.  The patient is on the cardiac monitor to evaluate  for evidence of arrhythmia and/or significant heart rate changes.  ----------------------------------------- 6:46 PM on 03/12/2023 -----------------------------------------  Lab workup significant for WBC count of 44, creatinine of 2.19, and lactate of 6.0.  Workup is consistent with acute infection/sepsis.  CT shows evidence of emphysematous cystitis.  I ordered IV ceftriaxone and fluids per the sepsis protocol.  I consulted and discussed case with Dr. Apolinar Junes from urology who reviewed the images.  She recommends placing a Foley if the patient's creatinine is elevated, and otherwise treating the infection/sepsis as any other UTI.  She advised that urology would consult in the hospital.  After 2 L of fluids, the patient's blood pressure remains low with a MAP just above 65.  I am concerned she may need vasopressor support.  I consulted Dr. Nemiah Commander from the ICU; based on our discussion he agreed to evaluate the patient for admission.  FINAL CLINICAL IMPRESSION(S) / ED DIAGNOSES   Final diagnoses:  Sepsis, due to unspecified organism, unspecified whether acute organ dysfunction present [facility])  Emphysematous cystitis     Rx / DC Orders   ED Discharge Orders     None        Note:  This document was prepared using Dragon voice recognition software and may include unintentional dictation errors.    Dionne Bucy, MD 03/12/23 [phone]

## 2023-03-12 NOTE — H&P (Cosign Needed Addendum)
NAME:  AMBER KANHAI, MRN:  [id number], DOB:  [birth year], LOS: 0 ADMISSION DATE:  03/12/2023, CONSULTATION DATE:  03/12/23 REFERRING MD:  Dr. Marisa Severin, CHIEF COMPLAINT:  Bilateral abdominal/flank pain   History of Present Illness:  77 yo M presenting to [facility] ED from home for evaluation of bilateral lower abdominal/flank pain.  History obtained per chart review and patient bedside report as well as phone conversation with her sister, [name]. Patient reports bilateral lower abdominal/flank pain over the last couple of days with associated nausea/vomiting, chills, sore throat and non-productive cough. She also endorses dysuria & polyuria. She denies fever, diarrhea, recent night sweats, fatigue, shortness of breath or chest pain. Of note the patient has noticeable poor dental hygiene. There is a hard mass present on the lower gum line that the patient states is an "old tooth". The area is tender on light palpation, the patient is unclear on the timeline of this mass. Sister reports that family has been encouraging the patient to see a dentist for the last 20 years or so. Patient also has tinnitus, this is chronic but makes communication intermittently challenging. Patient denies having a PCP or seeing a doctor for the last several years, she is also not on any chronic medications. She does not take any medications chronically except ibuprofen occasionally for shoulder pain. Sister reports the patient as being somewhat reclusive due to anxiety. She does not use tobacco products, ETOH or recreational drugs.  ED course: Upon arrival the patient was alert and responsive, with mild tachycardia and hypotension. Sepsis protocol initiated. Labs significant for AKI, AGMA, severe lactic acidosis with significant leukocytosis and left shift. CT abdomen/pelvis demonstrates emphysematous cystitis. EDP spoke with urology on call, who recommended foley catheter insertion if Cr elevated and treat for UTI. After IVF  resuscitation patient's BP marginal. PCCM consulted due to concern for need of vasopressor support. Medications given: ceftriaxone, 2 L LR bolus, morphine, zofran Initial Vitals: 97.8, 18, 105, 89/58 & 100% on RA Significant labs: (Labs/ Imaging personally reviewed) I, Cheryll Cockayne Rust-Chester, AGACNP-BC, personally viewed and interpreted this ECG. EKG Interpretation: Date: 03/12/23, EKG Time: 14:42, Rate: 119, Rhythm: ST, QRS Axis:  normal, Intervals: LAFB, ST/T Wave abnormalities: none, Narrative Interpretation: ST Chemistry: Na+: 137, K+: 3.6, BUN/Cr.: 30/2.19, Serum CO2/ AG: 15/18 Hematology: WBC: 44.8, Hgb: 13.6,  Troponin: 20, Lactic/ PCT: 6.0 > 2.2/ pending UA:  +mod Hgb, +5 ketones, + mod leuks +proteinuria, > 50 WBC's CXR 03/12/23: no active cardiopulmonary disease. Severe degenerative changes in the bilateral shoulders CT abdomen/ pelvis wo contrast 03/12/23:  Multifocal gaseous foci within the left renal collecting system and urinary bladder, which may be related to gas-forming infection given absent history of recent instrumentation. Recommend correlation with urinalysis and urine cultures. An 8 mm nonobstructing stone within the left renal pelvis. Diverticulosis without acute diverticulitis. Aortic Atherosclerosis.  PCCM consulted for admission due to severe sepsis secondary to emphysematous cystitis with AKI and AGMA and potential need for vasopressor support.  Pertinent  Medical History  Kidney Stones Diverticulosis Glaucoma  Significant Hospital Events: Including procedures, antibiotic start and stop dates in addition to other pertinent events   03/12/23: Admit to ICU with severe sepsis secondary to emphysematous cystitis with AKI and AGMA and potential need for vasopressor support.  Interim History / Subjective:  Patient alert and responsive, plan of care discussed - all questions answered at this time. - BP hypotensive, peripheral levophed ordered  Objective   Blood  pressure (!) 87/52, pulse 97,  temperature 98.1 F (36.7 C), temperature source Oral, resp. rate 17, height 5\' 3"  (1.6 m), SpO2 99%.       No intake or output data in the 24 hours ending 03/12/23 1907 There were no vitals filed for this visit.  Examination: General: Adult female, critically ill, lying in bed, NAD HEENT: MM pink/moist, anicteric, atraumatic, neck supple, dental decay with mass on lower gumline   Neuro: A&O x 4, able to follow commands, PERRL +3, MAE CV: s1s2 RRR, ST on monitor, no r/m/g Pulm: Regular, non labored on RA , breath sounds clear-BUL & clear/diminished-BLL GI: soft, rounded, non tender, bs x 4 GU: foley in place with cloudy yellow urine Skin: gumline mass- see above no other rashes/lesions noted Extremities: warm/dry, pulses + 2 R/P, trace edema noted BLE  Resolved Hospital Problem list     Assessment & Plan:  Severe Sepsis due to 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 Circulatory Shock Initial interventions/workup included: 2 L of NS/LR & Ceftriaxone - Supplemental oxygen as needed, to maintain SpO2 > 90% - f/u cultures, trend lactic/ PCT - Daily CBC, monitor WBC/ fever curve - IV antibiotics: meropenem - MRSA screening ordered - COVID swab ordered due to possible viral respiratory infection (cough/sore throat) - IVF hydration as needed: additional 1 L bolus ordered as lactic > 2 with marginal BP - Peripheral vasopressors to maintain MAP< 65: norepinephrine - urology consulted, appreciate input  Acute Kidney Injury suspect secondary to sepsis and emphysematous cystitis Non-obstructing 8 mm stone Baseline Cr: 0.9 (2018), Cr on admission: 2.19 - Strict I/O's: alert provider if UOP < 0.5 mL/kg/hr - gentle IVF hydration > sodium bicarbonate drip ordered - Daily BMP, replace electrolytes PRN - pain control with low dose Dilaudid due to AKI - Avoid nephrotoxic agents as able, ensure adequate renal perfusion  Oral Mass secondary to infection vs bone  abnormality vs oral cancer - CT maxillofacial ordered wo contrast due to AKI - if abscess present consider broader abx coverage - consult oral surgery if needed  Best Practice (right click and "Reselect all SmartList Selections" daily)  Diet/type: NPO w/ oral meds DVT prophylaxis: prophylactic heparin  GI prophylaxis: PPI Lines: N/A Foley:  Yes, and it is still needed Code Status:  full code Last date of multidisciplinary goals of care discussion [03/12/23]  Labs   CBC: Recent Labs  Lab 03/12/23 1446  WBC 44.8*  NEUTROABS 40.0*  HGB 13.6  HCT 40.6  MCV 94.0  PLT 198    Basic Metabolic Panel: Recent Labs  Lab 03/12/23 1446  NA 137  K 3.6  CL 104  CO2 15*  GLUCOSE 126*  BUN 30*  CREATININE 2.19*  CALCIUM 8.7*   GFR: CrCl cannot be calculated (Unknown ideal weight.). Recent Labs  Lab 03/12/23 1446 03/12/23 1542 03/12/23 1810  WBC 44.8*  --   --   LATICACIDVEN  --  6.0* 2.2*    Liver Function Tests: No results for input(s): "AST", "ALT", "ALKPHOS", "BILITOT", "PROT", "ALBUMIN" in the last 168 hours. No results for input(s): "LIPASE", "AMYLASE" in the last 168 hours. No results for input(s): "AMMONIA" in the last 168 hours.  ABG No results found for: "PHART", "PCO2ART", "PO2ART", "HCO3", "TCO2", "ACIDBASEDEF", "O2SAT"   Coagulation Profile: No results for input(s): "INR", "PROTIME" in the last 168 hours.  Cardiac Enzymes: No results for input(s): "CKTOTAL", "CKMB", "CKMBINDEX", "TROPONINI" in the last 168 hours.  HbA1C: No results found for: "HGBA1C"  CBG: No results for input(s): "GLUCAP" in the last 168 hours.  Review of Systems: Positives in BOLD  Gen: Denies fever, chills, weight change, fatigue, night sweats HEENT: Denies blurred vision, double vision, hearing loss, tinnitus, sinus congestion, rhinorrhea, sore throat, neck stiffness, dysphagia PULM: Denies shortness of breath, cough, sputum production, hemoptysis, wheezing CV: Denies chest  pain, edema, orthopnea, paroxysmal nocturnal dyspnea, palpitations GI: Denies abdominal pain, nausea, vomiting, diarrhea, hematochezia, melena, constipation, change in bowel habits GU: Denies dysuria, hematuria, polyuria, oliguria, urethral discharge, flank pain Endocrine: Denies hot or cold intolerance, polyuria, polyphagia or appetite change Derm: Denies rash, dry skin, scaling or peeling skin change Heme: Denies easy bruising, bleeding, bleeding gums Neuro: Denies headache, numbness, weakness, slurred speech, loss of memory or consciousness  Past Medical History:  She,  has a past medical history of Kidney stone and Renal disorder.   Surgical History:   Past Surgical History:  Procedure Laterality Date   LITHOTRIPSY     STOMACH SURGERY       Social History:   reports that she has never smoked. She has never used smokeless tobacco. She reports current alcohol use.   Family History:  Her family history is not on file.   Allergies Allergies  Allergen Reactions   Erythromycin Rash     Home Medications  Prior to Admission medications   Medication Sig Start Date End Date Taking? Authorizing Provider  latanoprost (XALATAN) 0.005 % ophthalmic solution  02/22/17   [provider]  ondansetron (ZOFRAN ODT) 4 MG disintegrating tablet Take 1 tablet (4 mg total) by mouth every 8 (eight) hours as needed for nausea or vomiting. Patient not taking: Reported on 03/12/2023 02/22/17   Bridget Hartshorn L, PA-C  ondansetron (ZOFRAN) 4 MG tablet Take 1 tablet (4 mg total) by mouth daily as needed. Patient not taking: Reported on 03/12/2023 09/03/16   Myrna Blazer, MD     Critical care time: 68 minutes       Cheryll Cockayne Rust-Chester, AGACNP-BC Acute Care Nurse Practitioner [facility]   [phone] / [phone] Please see Amion for details.

## 2023-03-12 NOTE — ED Triage Notes (Signed)
Bilateral flank pain that began "a couple days ago"; She has a history of kidney stones and states that the pain feels similar; BP 71/53 in triage; Does have mild discomfort when urinating

## 2023-03-12 NOTE — Sepsis Progress Note (Addendum)
1745 eLink is following this Code Sepsis. 1800 Confirmed that blood cultures were not done prior to administering the antibiotic as the blood cultures were not ordered. Order written by Dr Wilmon Pali.

## 2023-03-12 NOTE — Consult Note (Addendum)
Pharmacy Antibiotic Note  AMI CAYTON is a 77 y.o. female admitted on 03/12/2023 with bilateral lower abdominal/flank pain over past few days with associated nausea. Patient has history of kidney stones. CT abdomen/pelvis showing gaseous foci which may be related to gas-forming infection and a non-obstructing stone. Pharmacy has been consulted for meropenem dosing.  Plan: SCr 2.19 today with estimated CrCl 17.8 mL/min  Start meropenem 500 mg Q12H 9/26 AM Pharmacy will continue to monitor and dose adjust appropriately   Height: 5\' 3"  (160 cm) IBW/kg (Calculated) : 52.4  Temp (24hrs), Avg:97.8 F (36.6 C), Min:97.8 F (36.6 C), Max:97.8 F (36.6 C)  Recent Labs  Lab 03/12/23 1446 03/12/23 1542 03/12/23 1810  WBC 44.8*  --   --   CREATININE 2.19*  --   --   LATICACIDVEN  --  6.0* 2.2*    Antimicrobials this admission: Ceftriaxone 9/25 x 1 Meropenem 9/25 >>  Dose adjustments this admission:  Microbiology results: 9/25 BCx - sent   Thank you for allowing pharmacy to be a part of this patient's care.  Littie Deeds, PharmD PGY1 Pharmacy Resident  03/12/2023 6:55 PM

## 2023-03-12 NOTE — ED Notes (Addendum)
Waiting on sodium bicarb from pharmacy. Check tube station at this time. Medication not there.

## 2023-03-12 NOTE — ED Notes (Signed)
Taking over care of pt at this time

## 2023-03-12 NOTE — Progress Notes (Signed)
CODE SEPSIS - PHARMACY COMMUNICATION  **Broad Spectrum Antibiotics should be administered within 1 hour of Sepsis diagnosis**  Time Code Sepsis Called/Page Received: 1655  Antibiotics Ordered: 1658  Time of 1st antibiotic administration: 1700  Additional action taken by pharmacy:   If necessary, Name of Provider/Nurse Contacted:   Littie Deeds, PharmD PGY1 Pharmacy Resident  03/12/2023  5:19 PM

## 2023-03-12 NOTE — Progress Notes (Signed)
Pharmacy Antibiotic Note  Yvette Woodward is a 77 y.o. female admitted on 03/12/2023 with  osteomyelitis and cystitis .  Pharmacy has been consulted for Vancomycin dosing.  Plan: Discussed with NP.  Agreed to give Vancomycin 1250 mg IV X 1 loading dose on 9/25 @ ~ 2300 and defer to AM staff on whether or not to continue vanc.  Height: 5\' 3"  (160 cm) Weight: 59 kg (130 lb) IBW/kg (Calculated) : 52.4  Temp (24hrs), Avg:98 F (36.7 C), Min:97.8 F (36.6 C), Max:98.1 F (36.7 C)  Recent Labs  Lab 03/12/23 1446 03/12/23 1542 03/12/23 1810 03/12/23 2137  WBC 44.8*  --   --   --   CREATININE 2.19*  --   --   --   LATICACIDVEN  --  6.0* 2.2* 1.2    Estimated Creatinine Clearance: 17.8 mL/min (A) (by C-G formula based on SCr of 2.19 mg/dL (H)).    Allergies  Allergen Reactions   Erythromycin Rash    Antimicrobials this admission:   >>    >>   Dose adjustments this admission:   Microbiology results:  BCx:   UCx:    Sputum:    MRSA PCR:   Thank you for allowing pharmacy to be a part of this patient's care.  Anavictoria Wilk D 03/12/2023 11:15 PM

## 2023-03-13 ENCOUNTER — Encounter: Payer: Self-pay | Admitting: Internal Medicine

## 2023-03-13 DIAGNOSIS — I4892 Unspecified atrial flutter: Secondary | ICD-10-CM

## 2023-03-13 LAB — CBC
HCT: 32.8 % — ABNORMAL LOW (ref 36.0–46.0)
Hemoglobin: 11.4 g/dL — ABNORMAL LOW (ref 12.0–15.0)
MCH: 31.2 pg (ref 26.0–34.0)
MCHC: 34.8 g/dL (ref 30.0–36.0)
MCV: 89.9 fL (ref 80.0–100.0)
Platelets: 147 10*3/uL — ABNORMAL LOW (ref 150–400)
RBC: 3.65 MIL/uL — ABNORMAL LOW (ref 3.87–5.11)
RDW: 13.5 % (ref 11.5–15.5)
WBC: 32.7 10*3/uL — ABNORMAL HIGH (ref 4.0–10.5)
nRBC: 0 % (ref 0.0–0.2)

## 2023-03-13 LAB — RENAL FUNCTION PANEL
Albumin: 2.7 g/dL — ABNORMAL LOW (ref 3.5–5.0)
Anion gap: 11 (ref 5–15)
BUN: 35 mg/dL — ABNORMAL HIGH (ref 8–23)
CO2: 24 mmol/L (ref 22–32)
Calcium: 7.6 mg/dL — ABNORMAL LOW (ref 8.9–10.3)
Chloride: 101 mmol/L (ref 98–111)
Creatinine, Ser: 1.37 mg/dL — ABNORMAL HIGH (ref 0.44–1.00)
GFR, Estimated: 40 mL/min — ABNORMAL LOW (ref >60)
Glucose, Bld: 118 mg/dL — ABNORMAL HIGH (ref 70–99)
Phosphorus: 3.3 mg/dL (ref 2.5–4.6)
Potassium: 3.5 mmol/L (ref 3.5–5.1)
Sodium: 136 mmol/L (ref 135–145)

## 2023-03-13 LAB — CULTURE, BLOOD (ROUTINE X 2)
Special Requests: ADEQUATE
Special Requests: ADEQUATE

## 2023-03-13 LAB — MAGNESIUM: Magnesium: 2.3 mg/dL (ref 1.7–2.4)

## 2023-03-13 LAB — GLUCOSE, CAPILLARY
Glucose-Capillary: 96 mg/dL (ref 70–99)
Glucose-Capillary: 122 mg/dL — ABNORMAL HIGH (ref 70–99)
Glucose-Capillary: 115 mg/dL — ABNORMAL HIGH (ref 70–99)
Glucose-Capillary: 117 mg/dL — ABNORMAL HIGH (ref 70–99)
Glucose-Capillary: 99 mg/dL (ref 70–99)
Glucose-Capillary: 96 mg/dL (ref 70–99)

## 2023-03-13 LAB — TSH: TSH: 2.482 u[IU]/mL (ref 0.350–4.500)

## 2023-03-13 LAB — PROCALCITONIN: Procalcitonin: 32.82 ng/mL

## 2023-03-13 LAB — T4, FREE: Free T4: 1.08 ng/dL (ref 0.61–1.12)

## 2023-03-13 MED ORDER — HEPARIN BOLUS VIA INFUSION
3400.0000 [IU] | Freq: Once | INTRAVENOUS | Status: DC
  Filled 2023-03-13: qty 3400

## 2023-03-13 MED ORDER — CHLORHEXIDINE GLUCONATE CLOTH 2 % EX PADS
6.0000 | MEDICATED_PAD | Freq: Every day | CUTANEOUS | Status: DC
  Administered 2023-03-13 – 2023-03-15 (×3): 6 via TOPICAL
  Filled 2023-03-13: qty 6

## 2023-03-13 MED ORDER — LACTATED RINGERS IV BOLUS
500.0000 mL | Freq: Once | INTRAVENOUS | Status: AC
  Administered 2023-03-13: 500 mL via INTRAVENOUS

## 2023-03-13 MED ORDER — SODIUM CHLORIDE 0.9 % IV SOLN
1.0000 g | Freq: Two times a day (BID) | INTRAVENOUS | Status: DC
  Administered 2023-03-13 – 2023-03-14 (×4): 1 g via INTRAVENOUS
  Filled 2023-03-13 (×6): qty 20

## 2023-03-13 MED ORDER — ORAL CARE MOUTH RINSE
15.0000 mL | OROMUCOSAL | Status: DC
  Administered 2023-03-13 – 2023-03-16 (×9): 15 mL via OROMUCOSAL

## 2023-03-13 MED ORDER — ORAL CARE MOUTH RINSE: 15.0000 mL | OROMUCOSAL | Status: DC | PRN

## 2023-03-13 MED ORDER — HEPARIN (PORCINE) 25000 UT/250ML-% IV SOLN
1100.0000 [IU]/h | INTRAVENOUS | Status: DC
  Administered 2023-03-13: 800 [IU]/h via INTRAVENOUS
  Administered 2023-03-14: 1100 [IU]/h via INTRAVENOUS
  Filled 2023-03-13 (×2): qty 250

## 2023-03-13 MED ORDER — ADULT MULTIVITAMIN W/MINERALS CH
1.0000 | ORAL_TABLET | Freq: Every day | ORAL | Status: DC
  Administered 2023-03-14 – 2023-03-16 (×3): 1 via ORAL
  Filled 2023-03-13 (×3): qty 1

## 2023-03-13 MED ORDER — LACTATED RINGERS IV BOLUS
1000.0000 mL | Freq: Once | INTRAVENOUS | Status: AC
  Administered 2023-03-13: 1000 mL via INTRAVENOUS

## 2023-03-13 MED ORDER — HEPARIN BOLUS VIA INFUSION
3400.0000 [IU] | Freq: Once | INTRAVENOUS | Status: AC
  Administered 2023-03-13: 3400 [IU] via INTRAVENOUS
  Filled 2023-03-13: qty 3400

## 2023-03-13 MED ORDER — ENSURE ENLIVE PO LIQD
237.0000 mL | Freq: Two times a day (BID) | ORAL | Status: DC
  Administered 2023-03-13 – 2023-03-16 (×5): 237 mL via ORAL

## 2023-03-13 NOTE — Plan of Care (Signed)
  Problem: Education: Goal: Knowledge of General Education information will improve Description: Including pain rating scale, medication(s)/side effects and non-pharmacologic comfort measures Outcome: Progressing   Problem: Health Behavior/Discharge Planning: Goal: Ability to manage health-related needs will improve Outcome: Progressing   Problem: Clinical Measurements: Goal: Ability to maintain clinical measurements within normal limits will improve Outcome: Progressing   Problem: Safety: Goal: Ability to remain free from injury will improve Outcome: Progressing   Problem: Skin Integrity: Goal: Risk for impaired skin integrity will decrease Outcome: Progressing   Problem: Fluid Volume: Goal: Hemodynamic stability will improve Outcome: Progressing   Problem: Clinical Measurements: Goal: Diagnostic test results will improve Outcome: Progressing   Problem: Respiratory: Goal: Ability to maintain adequate ventilation will improve Outcome: Progressing

## 2023-03-13 NOTE — Progress Notes (Signed)
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 This is favored to represent a fracture fragment with possible adjacent osteomyelitis. Direct visualization is recommended. No soft tissue abscess. Periodontal disease. Chronic right maxillary sinusitis - vancomycin added to meropenem for osteo coverage - will need ENT follow up   Yvette Woodward, AGACNP-BC Acute Care Nurse Practitioner [facility]   [phone] / [phone] Please see Amion for details.

## 2023-03-13 NOTE — Progress Notes (Addendum)
ANTICOAGULATION CONSULT NOTE - Initial Consult  Pharmacy Consult for Heparin  Indication: atrial fibrillation  Allergies  Allergen Reactions   Erythromycin Rash    Patient Measurements: Height: 5\' 3"  (160 cm) Weight: 56.3 kg (124 lb 1.9 oz) IBW/kg (Calculated) : 52.4 Heparin Dosing Weight:  56.3 kg   Vital Signs: Temp: 98.1 F (36.7 C) (09/26 2000) Temp Source: Oral (09/26 2000) BP: 90/68 (09/26 2100) Pulse Rate: 88 (09/26 2100)  Labs: Recent Labs    03/12/23 1446 03/12/23 1542 03/12/23 2137 03/13/23 0633  HGB 13.6  --   --  11.4*  HCT 40.6  --   --  32.8*  PLT 198  --   --  147*  CREATININE 2.19*  --   --  1.37*  TROPONINIHS  --  20* 10  --     Estimated Creatinine Clearance: 28.4 mL/min (A) (by C-G formula based on SCr of 1.37 mg/dL (H)).   Medical History: Past Medical History:  Diagnosis Date   Kidney stone    Renal disorder    kidney stones    Medications:  Medications Prior to Admission  Medication Sig Dispense Refill Last Dose   latanoprost (XALATAN) 0.005 % ophthalmic solution  (Patient not taking: Reported on 03/12/2023)   Not Taking   ondansetron (ZOFRAN ODT) 4 MG disintegrating tablet Take 1 tablet (4 mg total) by mouth every 8 (eight) hours as needed for nausea or vomiting. (Patient not taking: Reported on 03/12/2023) 15 tablet 0 Not Taking   ondansetron (ZOFRAN) 4 MG tablet Take 1 tablet (4 mg total) by mouth daily as needed. (Patient not taking: Reported on 03/12/2023) 10 tablet 0 Not Taking    Assessment: Pharmacy consulted to dose heparin in this 77 year old female with Afib.  Pt received heparin 5000 units SQ on 9/26 @ 1440.  Possible stent placement on 9/27. CrCl = 28.4 ml/min   Goal of Therapy:  Heparin level 0.3-0.7 units/ml Monitor platelets by anticoagulation protocol: Yes   Plan:  - Pt received heparin 5000 units SQ ~ 8 hrs ago so will go ahead and bolus this pt.  Give 3400 units bolus x 1 Start heparin infusion at 800  units/hr Check anti-Xa level in 8 hours and daily while on heparin Continue to monitor H&H and platelets  - Will need to f/u plans for heparin on 9/27 AM since pt may have stent placement  Malikah Lakey D 03/13/2023,10:09 PM

## 2023-03-13 NOTE — ED Notes (Signed)
Report given to ICU RN

## 2023-03-13 NOTE — Consult Note (Signed)
Pharmacy Antibiotic Note  Yvette Woodward is a 76 y.o. female admitted on 03/12/2023 with bilateral lower abdominal/flank pain over past few days with associated nausea. Patient has history of kidney stones. CT abdomen/pelvis showing gaseous foci which may be related to gas-forming infection and a non-obstructing stone. Pharmacy has been consulted for meropenem dosing.  AKI resolving  Plan:  Adjust meropenem to 1 g IV q12h  Height: 5\' 3"  (160 cm) Weight: 56.3 kg (124 lb 1.9 oz) IBW/kg (Calculated) : 52.4  Temp (24hrs), Avg:98.1 F (36.7 C), Min:97.8 F (36.6 C), Max:98.4 F (36.9 C)  Recent Labs  Lab 03/12/23 1446 03/12/23 1542 03/12/23 1810 03/12/23 2137 03/13/23 0633  WBC 44.8*  --   --   --  32.7*  CREATININE 2.19*  --   --   --  1.37*  LATICACIDVEN  --  6.0* 2.2* 1.2  --     Antimicrobials this admission: Ceftriaxone 9/25 x 1 Meropenem 9/25 >> Vancomycin 9/25 >>  Dose adjustments this admission: N/A  Microbiology results: 9/25 Bcx: NGTD 9/25 Ucx: pending 9/25 MRSA PCR: (-)  Thank you for allowing pharmacy to be a part of this patient's care.  Tressie Ellis 03/13/2023 8:39 AM

## 2023-03-13 NOTE — Consult Note (Signed)
Urology Consult  I have been asked to see the patient by Dr. Dionne Bucy and Dr. 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  Chief Complaint: Bilateral lower abdominal pain and bilateral flank pain.   History of Present Illness: Yvette Woodward is a 77 y.o. year old female who was a previous patient in our clinic in 2016 and underwent left ureteral stent placement for a septic stone and then ultimately ureteroscopy for definitive treatment of that stone who presented to the emergency room yesterday with complaints of bilateral lower abdominal and flank pain associated with nausea.  Upon presentation she was found to be hypotensive and tachycardic.  Her WBC count was found to be 44, creatinine of 2.19 and a lactate of 6.0.  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   She was admitted to the ICU for vasopressor support.  She has not sought medical care or been hospitalized over the last several years.  She denied any further episodes of stone disease and denied seeing any other urologist.  This morning, she states she feels well.  The lower abdominal pain and back pain have abated.  She remains on pressors.  Foley catheter is in place with decreased urine output, but urine is clear yellow.  She is afebrile, but remains tachycardic with a soft BP.  Urinalysis from yesterday is grossly infected.  Urine culture is pending.  Blood cultures are pending.  Her serum creatinine has improved from 2.19 yesterday to 1.37 this morning.  Her EGFR is improved from 23 yesterday to 40 this morning.  Her WBC count has decreased from 44.8-32.7.    Past Medical History:  Diagnosis Date   Kidney stone    Renal disorder    kidney stones    Past Surgical History:  Procedure Laterality Date   LITHOTRIPSY     STOMACH SURGERY      Home Medications:  No current facility-administered medications on file  prior to encounter.   Current Outpatient Medications on File Prior to Encounter  Medication Sig Dispense Refill   latanoprost (XALATAN) 0.005 % ophthalmic solution  (Patient not taking: Reported on 03/12/2023)     ondansetron (ZOFRAN ODT) 4 MG disintegrating tablet Take 1 tablet (4 mg total) by mouth every 8 (eight) hours as needed for nausea or vomiting. (Patient not taking: Reported on 03/12/2023) 15 tablet 0   ondansetron (ZOFRAN) 4 MG tablet Take 1 tablet (4 mg total) by mouth daily as needed. (Patient not taking: Reported on 03/12/2023) 10 tablet 0    Allergies:  Allergies  Allergen Reactions   Erythromycin Rash    History reviewed. No pertinent family history.  Social History:  reports that she has never smoked. She has never used smokeless tobacco. She reports current alcohol use. No history on file for drug use.  ROS: A complete review of systems was performed.  All systems are negative except for pertinent findings as noted.  Physical Exam:  Vital signs in last 24 hours: Temp:  [97.8 F (36.6 C)-98.4 F (36.9 C)] 98 F (36.7 C) (09/26 0400) Pulse Rate:  [85-127] 127 (09/26 0730) Resp:  [13-30] 16 (09/26 0730) BP: (71-128)/(40-100) 100/49 (09/26 0730) SpO2:  [92 %-100 %] 95 % (09/26 0730) Weight:  [56.3 kg-59 kg] 56.3 kg (09/26 0117) Constitutional:  Alert and oriented, No acute distress HEENT: [place] AT, missing several teeth, lower gum mass noted.  Trachea midline Cardiovascular: No  clubbing, cyanosis, or edema. Respiratory: Normal respiratory effort, lungs clear bilaterally GI: Abdomen is soft, nontender, nondistended, no abdominal masses GU: No CVA tenderness Neurologic: Grossly intact, no focal deficits, moving all 4 extremities Psychiatric: Normal mood and affect  Laboratory Data:  Recent Labs    03/12/23 1446 03/13/23 0633  WBC 44.8* 32.7*  HGB 13.6 11.4*  HCT 40.6 32.8*   Recent Labs    03/12/23 1446 03/13/23 0633  NA 137 136  K 3.6 3.5  CL 104 101   CO2 15* 24  GLUCOSE 126* 118*  BUN 30* 35*  CREATININE 2.19* 1.37*  CALCIUM 8.7* 7.6*   No results for input(s): "LABPT", "INR" in the last 72 hours. No results for input(s): "LABURIN" in the last 72 hours. Results for orders placed or performed during the hospital encounter of 03/12/23  MRSA Next Gen by PCR, Nasal     Status: None   Collection Time: 03/12/23  9:37 PM   Specimen: Nasal Mucosa; Nasal Swab  Result Value Ref Range Status   MRSA by PCR Next Gen NOT DETECTED NOT DETECTED Final    Comment: (NOTE) The GeneXpert MRSA Assay (FDA approved for NASAL specimens only), is one component of a comprehensive MRSA colonization surveillance program. It is not intended to diagnose MRSA infection nor to guide or monitor treatment for MRSA infections. Test performance is not FDA approved in patients less than 65 years old. Performed at [facility], [address] [place], [place] [address]   SARS Coronavirus 2 by RT PCR (hospital order, performed in [facility] hospital lab) *cepheid single result test* Nasal Mucosa     Status: None   Collection Time: 03/12/23  9:37 PM   Specimen: Nasal Mucosa; Nasal Swab  Result Value Ref Range Status   SARS Coronavirus 2 by RT PCR NEGATIVE NEGATIVE Final    Comment: (NOTE) SARS-CoV-2 target nucleic acids are NOT DETECTED.  The SARS-CoV-2 RNA is generally detectable in upper and lower respiratory specimens during the acute phase of infection. The lowest concentration of SARS-CoV-2 viral copies this assay can detect is 250 copies / mL. A negative result does not preclude SARS-CoV-2 infection and should not be used as the sole basis for treatment or other patient management decisions.  A negative result may occur with improper specimen collection / handling, submission of specimen other than nasopharyngeal swab, presence of viral mutation(s) within the areas targeted by this assay, and inadequate number of viral copies (<250 copies /  mL). A negative result must be combined with clinical observations, patient history, and epidemiological information.  Fact Sheet for Patients:   RoadLapTop.co.za  Fact Sheet for Healthcare Providers: http://kim-miller.com/  This test is not yet approved or  cleared by the [place] FDA and has been authorized for detection and/or diagnosis of SARS-CoV-2 by FDA under an Emergency Use Authorization (EUA).  This EUA will remain in effect (meaning this test can be used) for the duration of the COVID-19 declaration under Section 564(b)(1) of the Act, 21 U.S.C. section 360bbb-3(b)(1), unless the authorization is terminated or revoked sooner.  Performed at [facility], [address], [place], [place] [address]      Radiologic Imaging: CT MAXILLOFACIAL WO CONTRAST  Result Date: 03/12/2023 CLINICAL DATA:  Sublingual/submandibular abscess suspected. Injury to the lower jaw. EXAM: CT MAXILLOFACIAL WITHOUT CONTRAST TECHNIQUE: Multidetector CT imaging of the maxillofacial structures was performed. Multiplanar CT image reconstructions were also generated. RADIATION DOSE REDUCTION: This exam was performed according to the departmental  dose-optimization program which includes automated exposure control, adjustment of the mA and/or kV according to patient size and/or use of iterative reconstruction technique. COMPARISON:  None Available. FINDINGS: Osseous: Small osseous fragment adjacent to an area of cortical irregularity about the posterior right mandible (series 4/image 41 and 7/27). No mandibular dislocation. Multiple missing teeth. Periapical lucencies about multiple remaining teeth compatible with periodontal disease. Orbits: Negative. No traumatic or inflammatory finding. Sinuses: Mucosal thickening in the right maxillary sinus with hyperostosis of the right maxillary sinus walls. Paranasal sinuses are otherwise well aerated. No mastoid  effusion. Soft tissues: No soft tissue abscess. Limited intracranial: No acute abnormality. IMPRESSION: 1. Small osseous fragment adjacent to an area of cortical irregularity about the posterior right mandible. This is favored to represent a fracture fragment with possible adjacent osteomyelitis. Direct visualization is recommended. No soft tissue abscess. 2. Periodontal disease. 3. Chronic right maxillary sinusitis Electronically Signed   By: Minerva Fester M.D.   On: 03/12/2023 22:08   CT ABDOMEN PELVIS WO CONTRAST  Result Date: 03/12/2023 CLINICAL DATA:  Several day history of bilateral flank pain associated with mild dysuria EXAM: CT ABDOMEN AND PELVIS WITHOUT CONTRAST TECHNIQUE: Multidetector CT imaging of the abdomen and pelvis was performed following the standard protocol without IV contrast. RADIATION DOSE REDUCTION: This exam was performed according to the departmental dose-optimization program which includes automated exposure control, adjustment of the mA and/or kV according to patient size and/or use of iterative reconstruction technique. COMPARISON:  CT abdomen and pelvis dated 02/22/2017 FINDINGS: Lower chest: No focal consolidation or pulmonary nodule in the lung bases. No pleural effusion or pneumothorax demonstrated. Partially imaged heart size is normal. Coronary artery calcifications. Hepatobiliary: No focal hepatic lesions. No intra or extrahepatic biliary ductal dilation. Normal gallbladder. Pancreas: Similar prominence of the pancreatic duct measuring 3 mm. Spleen: Normal in size without focal abnormality. Adrenals/Urinary Tract: No adrenal nodules. No suspicious renal mass on this noncontrast enhanced examination or hydronephrosis. Multifocal gaseous foci within the left renal collecting system. 8 mm nonobstructing stone within the left renal pelvis, which demonstrates mural thickening. Intraluminal gas within the urinary bladder. Stomach/Bowel: Metallic radiodensities in the gastric  fundus, likely postsurgical. No evidence of bowel wall thickening, distention, or inflammatory changes. Appendix is not discretely seen. Diverticulosis without acute diverticulitis. Normal appendix. Vascular/Lymphatic: Aortic atherosclerosis. No enlarged abdominal or pelvic lymph nodes. Reproductive: No adnexal masses. Other: No free fluid, fluid collection, or free air. Musculoskeletal: No acute or abnormal lytic or blastic osseous lesions. Multilevel degenerative changes of the partially imaged thoracic and lumbar spine. IMPRESSION: 1. Multifocal gaseous foci within the left renal collecting system and urinary bladder, which may be related to gas-forming infection given absent history of recent instrumentation. Recommend correlation with urinalysis and urine cultures. 2. An 8 mm nonobstructing stone within the left renal pelvis. 3. Diverticulosis without acute diverticulitis. 4. Aortic Atherosclerosis (ICD10-I70.0). Coronary artery calcifications. Assessment for potential risk factor modification, dietary therapy or pharmacologic therapy may be warranted, if clinically indicated. These results were called by telephone at the time of interpretation on 03/12/2023 at 4:58 pm to provider [facility] , who verbally acknowledged these results. Electronically Signed   By: Agustin Cree M.D.   On: 03/12/2023 17:00   DG Chest Portable 1 View  Result Date: 03/12/2023 CLINICAL DATA:  Several day history of bilateral flank pain EXAM: PORTABLE CHEST 1 VIEW COMPARISON:  Chest radiograph dated 02/22/2017 FINDINGS: Mildly low lung volumes. No focal consolidations. No pleural effusion or pneumothorax. The heart size  and mediastinal contours are within normal limits. Severe degenerative changes of the bilateral shoulders. Irregular ossification is again seen projecting over the right scapula stable over multiple prior studies. IMPRESSION: No active disease. Electronically Signed   By: Agustin Cree M.D.   On: 03/12/2023 16:49     Impression/Assessment:  77 year old female with a prior history of a septic stone who presents to the ED yesterday and found to have sepsis in the setting of emphysematous cystitis with incidental finding of a nonobstructing left pelvic stone.  She is admitted to the ICU for vasopressor support.  -Serum creatinine is improving from 2.9 yesterday to 1.37  -WBC count improving from 44.8 yesterday to 32.7  Plan:  -continue maximum bladder drainage with Foley catheter until patient has clinical improvement -continue broad-spectrum antibiotic coverage until cultures are available, will need to be on 2 weeks of culture appropriate antibiotics -Nonobstructing left pelvic renal stone will be addressed on an outpatient basis, will need follow up appointment with urology upon discharge   03/13/2023, 7:52 AM  Soyla Bainter, PA-C

## 2023-03-13 NOTE — Progress Notes (Signed)
eLink Physician-Brief Progress Note Patient Name: Yvette Woodward DOB: Apr 18, 1946 MRN: [id number]   Date of Service  03/13/2023  HPI/Events of Note  Patient admitted with severe sepsis of urinary tract origin.  eICU Interventions  New Patient Evaluation.        [name] U Coralie Stanke 03/13/2023, 1:28 AM

## 2023-03-13 NOTE — Progress Notes (Signed)
Patient A/O x4, room air, NSR/ST on pressure support. Pressors titrated per orders. Weaned off. Tolerating. Patient received 1.5 L LR bolus during this shift, effective. Family updated at bedside.

## 2023-03-13 NOTE — Progress Notes (Signed)
Initial Nutrition Assessment  DOCUMENTATION CODES:   Non-severe (moderate) malnutrition in context of social or environmental circumstances  INTERVENTION:   Ensure Enlive po BID, each supplement provides 350 kcal and 20 grams of protein.  Magic cup TID with meals, each supplement provides 290 kcal and 9 grams of protein  MVI po daily   Pt at high refeed risk; recommend monitor potassium, magnesium and phosphorus labs daily until stable  Daily weights   Check B12 level   NUTRITION DIAGNOSIS:   Moderate Malnutrition related to social / environmental circumstances as evidenced by mild fat depletion, mild muscle depletion.  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  Met with pt in room today. Pt reports decreased appetite and oral intake for several years. Pt reports that she has never been a big eater. Pt reports that her mother, whom she was really close to, died several years ago. Pt reports that she has been suffering from some depression since her mother died and that she has been neglecting her health. Pt specifically mentions neglecting her teeth; pt with poor dentition and is noted to have a gum mass. Pt reports poor oral intake for several days pta r/t nausea and abdominal pain. Pt reports that she ate fairly well at breakfast today. RD discussed with pt the importance of adequate nutrition needed to preserve lean muscle. Pt is willing to drink chocolate Ensure in hospital. Pt is at high refeed risk. There is no recent weight history documented in chart; pt reports that she has lost ~5lbs.   Of note,  pt with h/o partial gastrectomy; will check B12 level.   Medications reviewed and include: heparin, protonix, meropenem, levophed  Labs reviewed: K 3.5 wnl, BUN 35(H), creat 1.37(H), P 3.3 wnl, Mg 2.3 wnl Wbc- 32.7(H) Cbgs- 117, 115, 122, 96 x 24 hrs   NUTRITION - FOCUSED PHYSICAL EXAM:  Flowsheet Row Most Recent Value  Orbital Region Mild depletion  Upper Arm Region Mild depletion  Thoracic and Lumbar Region Mild depletion  Buccal Region Mild depletion  Temple Region Moderate depletion  Clavicle Bone Region Moderate depletion  Clavicle and Acromion Bone Region Moderate depletion  Scapular Bone Region Mild depletion  Dorsal Hand Mild depletion  Patellar Region Mild depletion  Anterior Thigh Region Mild depletion  Posterior Calf Region Mild depletion  Edema (RD Assessment) None  Hair Reviewed  Eyes Reviewed  Mouth Reviewed  Skin Reviewed  Nails Reviewed   Diet Order:   Diet Order             Diet NPO time specified  Diet effective midnight           Diet regular Room service appropriate? Yes; Fluid consistency: Thin  Diet effective now                  EDUCATION NEEDS:   Education needs have been addressed  Skin:  Skin Assessment: Reviewed RN Assessment  Last BM:  pta  Height:   Ht Readings from Last 1 Encounters:  03/13/23 5\' 3"  (1.6 m)    Weight:   Wt Readings from Last 1 Encounters:  03/13/23 56.3 kg    Ideal Body Weight:  52.2 kg  BMI:  Body mass index is 21.99 kg/m.  Estimated Nutritional Needs:   Kcal:  1400-1600kcal/day  Protein:  70-80g/day  Fluid:  1.3-1.5L/day  Betsey Holiday MS, RD, LDN Please refer to [facility] for RD and/or RD on-call/weekend/after hours pager

## 2023-03-13 NOTE — Consult Note (Signed)
PHARMACY CONSULT NOTE  Pharmacy Consult for Electrolyte Monitoring and Replacement   Recent Labs: Potassium (mmol/L)  Date Value  03/13/2023 3.5   Magnesium (mg/dL)  Date Value  45/40/9811 2.3   Calcium (mg/dL)  Date Value  91/47/8295 7.6 (L)   Albumin (g/dL)  Date Value  62/13/0865 2.7 (L)   Phosphorus (mg/dL)  Date Value  78/46/9629 3.3   Sodium (mmol/L)  Date Value  03/13/2023 136   Assessment: 77 y/o F with medical history including kidney stones, diverticulosis, glaucoma admitted with emphysematous cystitis complicated by septic shock. Pharmacy consulted to assist with electrolyte monitoring and replacement as indicated.  Goal of Therapy:  Electrolytes within normal limits  Plan:  --No electrolyte replacement indicated at this time --Follow-up electrolytes with AM labs tomorrow  Tressie Ellis 03/13/2023 8:37 AM

## 2023-03-13 NOTE — Progress Notes (Addendum)
Updated urological plans.  On further discussion with urology team, we are going to go ahead and plan for her to be NPO at midnight and reassess in the am.   If she has not progressed off vasopressor or WBC count has not improved significantly, we will plan on placing a left ureteral stent.  I have discussed this with the patient and I have spoken with her sister, [name],  per her request.

## 2023-03-13 NOTE — Plan of Care (Signed)

## 2023-03-14 ENCOUNTER — Inpatient Hospital Stay (HOSPITAL_COMMUNITY)
Admit: 2023-03-14 | Discharge: 2023-03-14 | Disposition: A | Discharge status: Home / Self Care | Payer: Medicare Other | Attending: Pulmonary Disease

## 2023-03-14 DIAGNOSIS — A419 Sepsis, unspecified organism: Secondary | ICD-10-CM | POA: Diagnosis not present

## 2023-03-14 DIAGNOSIS — I4892 Unspecified atrial flutter: Secondary | ICD-10-CM | POA: Diagnosis not present

## 2023-03-14 DIAGNOSIS — R6521 Severe sepsis with septic shock: Secondary | ICD-10-CM | POA: Diagnosis not present

## 2023-03-14 DIAGNOSIS — I480 Paroxysmal atrial fibrillation: Secondary | ICD-10-CM

## 2023-03-14 LAB — RENAL FUNCTION PANEL
Albumin: 2.3 g/dL — ABNORMAL LOW (ref 3.5–5.0)
Anion gap: 9 (ref 5–15)
BUN: 24 mg/dL — ABNORMAL HIGH (ref 8–23)
CO2: 25 mmol/L (ref 22–32)
Calcium: 7.6 mg/dL — ABNORMAL LOW (ref 8.9–10.3)
Chloride: 105 mmol/L (ref 98–111)
Creatinine, Ser: 0.94 mg/dL (ref 0.44–1.00)
GFR, Estimated: >60 mL/min (ref >60)
Glucose, Bld: 90 mg/dL (ref 70–99)
Phosphorus: 2.4 mg/dL — ABNORMAL LOW (ref 2.5–4.6)
Potassium: 3.5 mmol/L (ref 3.5–5.1)
Sodium: 139 mmol/L (ref 135–145)

## 2023-03-14 LAB — GLUCOSE, CAPILLARY: Glucose-Capillary: 92 mg/dL (ref 70–99)

## 2023-03-14 LAB — CBC
HCT: 30.6 % — ABNORMAL LOW (ref 36.0–46.0)
Hemoglobin: 10.5 g/dL — ABNORMAL LOW (ref 12.0–15.0)
MCH: 31.1 pg (ref 26.0–34.0)
MCHC: 34.3 g/dL (ref 30.0–36.0)
MCV: 90.5 fL (ref 80.0–100.0)
Platelets: 104 10*3/uL — ABNORMAL LOW (ref 150–400)
RBC: 3.38 MIL/uL — ABNORMAL LOW (ref 3.87–5.11)
RDW: 13.2 % (ref 11.5–15.5)
WBC: 14.8 10*3/uL — ABNORMAL HIGH (ref 4.0–10.5)
nRBC: 0 % (ref 0.0–0.2)

## 2023-03-14 LAB — ECHOCARDIOGRAM COMPLETE
AR max vel: 1.57 cm2
AV Area VTI: 1.61 cm2
AV Area mean vel: 1.57 cm2
AV Mean grad: 3 mm[Hg]
AV Peak grad: 5.7 mm[Hg]
Ao pk vel: 1.19 m/s
Area-P 1/2: 7.22 cm2
Height: 63 in
MV VTI: 2.1 cm2
S' Lateral: 2.8 cm
Weight: 1985.9 [oz_av]

## 2023-03-14 LAB — HEPARIN LEVEL (UNFRACTIONATED)
Heparin Unfractionated: 0.29 [IU]/mL — ABNORMAL LOW (ref 0.30–0.70)
Heparin Unfractionated: 0.27 [IU]/mL — ABNORMAL LOW (ref 0.30–0.70)

## 2023-03-14 LAB — VITAMIN B12: Vitamin B-12: 209 pg/mL (ref 180–914)

## 2023-03-14 LAB — PROCALCITONIN: Procalcitonin: 14.18 ng/mL

## 2023-03-14 LAB — MAGNESIUM: Magnesium: 2.2 mg/dL (ref 1.7–2.4)

## 2023-03-14 MED ORDER — HEPARIN BOLUS VIA INFUSION
900.0000 [IU] | Freq: Once | INTRAVENOUS | Status: AC
  Administered 2023-03-14: 900 [IU] via INTRAVENOUS
  Filled 2023-03-14: qty 900

## 2023-03-14 MED ORDER — HEPARIN BOLUS VIA INFUSION
850.0000 [IU] | Freq: Once | INTRAVENOUS | Status: AC
  Administered 2023-03-14: 850 [IU] via INTRAVENOUS
  Filled 2023-03-14: qty 850

## 2023-03-14 MED ORDER — VITAMIN B-12 1000 MCG PO TABS
1000.0000 ug | ORAL_TABLET | Freq: Every day | ORAL | Status: DC
  Administered 2023-03-15 – 2023-03-16 (×2): 1000 ug via ORAL
  Filled 2023-03-14 (×2): qty 1

## 2023-03-14 NOTE — Progress Notes (Signed)
Urology Consult Follow Up  Subjective: Patient resting comfortably in bed.    She is off pressors.   Her WBC count has improved from 32.7 yesterday to 14.8 this morning.  Her serum creatinine has improved from 1.37 to 0.94 this morning.  And her GFR is increased to greater than 60.  Her urine output is also improved putting out 975 cc over the last 24 hours.    Urine cultures still pending, preliminary blood cultures are no growth.  Anti-infectives: Anti-infectives (From admission, onward)    Start     Dose/Rate Route Frequency Ordered Stop   03/13/23 1000  meropenem (MERREM) 500 mg in sodium chloride 0.9 % 100 mL IVPB  Status:  Discontinued        500 mg 200 mL/hr over 30 Minutes Intravenous Every 12 hours 03/12/23 1941 03/13/23 0832   03/13/23 1000  meropenem (MERREM) 1 g in sodium chloride 0.9 % 100 mL IVPB        1 g 200 mL/hr over 30 Minutes Intravenous Every 12 hours 03/13/23 0832     03/12/23 2300  vancomycin (VANCOREADY) IVPB 1250 mg/250 mL        1,250 mg 166.7 mL/hr over 90 Minutes Intravenous  Once 03/12/23 2258 03/13/23 0135   03/12/23 1900  meropenem (MERREM) 1 g in sodium chloride 0.9 % 100 mL IVPB        1 g 200 mL/hr over 30 Minutes Intravenous  Once 03/12/23 1849 03/12/23 1957   03/12/23 1700  cefTRIAXone (ROCEPHIN) 2 g in sodium chloride 0.9 % 100 mL IVPB  Status:  Discontinued        2 g 200 mL/hr over 30 Minutes Intravenous Every 24 hours 03/12/23 1658 03/12/23 1839       Current Facility-Administered Medications  Medication Dose Route Frequency Provider Last Rate Last Admin   0.9 %  sodium chloride infusion  250 mL Intravenous PRN Erin Fulling, MD       0.9 %  sodium chloride infusion  250 mL Intravenous Continuous Rust-Chester, Cecelia Byars, NP   Stopped at 03/12/23 2112   acetaminophen (TYLENOL) tablet 650 mg  650 mg Oral Q4H PRN Erin Fulling, MD       Chlorhexidine Gluconate Cloth 2 % PADS 6 each  6 each Topical Daily Erin Fulling, MD   6 each at 03/13/23  2100   docusate sodium (COLACE) capsule 100 mg  100 mg Oral BID PRN Erin Fulling, MD       feeding supplement (ENSURE ENLIVE / ENSURE PLUS) liquid 237 mL  237 mL Oral BID BM Kasa, Kurian, MD   237 mL at 03/13/23 1440   heparin ADULT infusion 100 units/mL (25000 units/267mL)  800 Units/hr Intravenous Continuous Erin Fulling, MD 8 mL/hr at 03/14/23 0600 800 Units/hr at 03/14/23 0600   HYDROmorphone (DILAUDID) injection 0.25 mg  0.25 mg Intravenous Q3H PRN Rust-Chester, Micheline Rough L, NP       meropenem (MERREM) 1 g in sodium chloride 0.9 % 100 mL IVPB  1 g Intravenous Q12H Tressie Ellis, RPH   Stopped at 03/13/23 2312   multivitamin with minerals tablet 1 tablet  1 tablet Oral Daily Erin Fulling, MD       norepinephrine (LEVOPHED) 4mg  in (0.016 mg/mL) premix infusion  2-10 mcg/min Intravenous Titrated Erin Fulling, MD   Stopped at 03/13/23 1247   ondansetron (ZOFRAN) injection 4 mg  4 mg Intravenous Q6H PRN Erin Fulling, MD   4 mg at 03/13/23 2006  Oral care mouth rinse  15 mL Mouth Rinse 4 times per day Rust-Chester, Cecelia Byars, NP   15 mL at 03/13/23 1253   Oral care mouth rinse  15 mL Mouth Rinse PRN Rust-Chester, Cecelia Byars, NP       pantoprazole (PROTONIX) injection 40 mg  40 mg Intravenous Q24H Rust-Chester, Britton L, NP   40 mg at 03/13/23 2006   polyethylene glycol (MIRALAX / GLYCOLAX) packet 17 g  17 g Oral Daily PRN Erin Fulling, MD       sodium chloride flush (NS) 0.9 % injection 3 mL  3 mL Intravenous Q12H Erin Fulling, MD   3 mL at 03/13/23 2006   sodium chloride flush (NS) 0.9 % injection 3 mL  3 mL Intravenous PRN Erin Fulling, MD         Objective: Vital signs in last 24 hours: Temp:  [98 F (36.7 C)-98.5 F (36.9 C)] 98.5 F (36.9 C) (09/27 0200) Pulse Rate:  [79-121] 96 (09/27 0600) Resp:  [14-27] 19 (09/27 0600) BP: (87-133)/(46-85) 133/85 (09/27 0600) SpO2:  [88 %-98 %] 95 % (09/27 0600)  Intake/Output from previous day: 09/26 0701 - 09/27 0700 In: 1943  [P.O.:720; I.V.:242.5; IV Piggyback:980.5] Out: 975 [Urine:975] Intake/Output this shift: No intake/output data recorded.   Physical Exam Vitals and nursing note reviewed.  HENT:     Head: Normocephalic.     Nose: Nose normal.     Mouth/Throat:     Comments: Poor dentition with a mass in her lower gums.  Eyes:     Extraocular Movements: Extraocular movements intact.     Conjunctiva/sclera: Conjunctivae normal.     Pupils: Pupils are equal, round, and reactive to light.  Pulmonary:     Effort: Pulmonary effort is normal.  Abdominal:     General: Abdomen is flat.     Palpations: Abdomen is soft.  Musculoskeletal:     Cervical back: Normal range of motion.  Skin:    General: Skin is warm.  Neurological:     General: No focal deficit present.     Mental Status: She is alert and oriented to person, place, and time.  Psychiatric:        Mood and Affect: Mood normal.     Lab Results:  Recent Labs    03/13/23 0633 03/14/23 0309  WBC 32.7* 14.8*  HGB 11.4* 10.5*  HCT 32.8* 30.6*  PLT 147* 104*   BMET Recent Labs    03/13/23 0633 03/14/23 0309  NA 136 139  K 3.5 3.5  CL 101 105  CO2 24 25  GLUCOSE 118* 90  BUN 35* 24*  CREATININE 1.37* 0.94  CALCIUM 7.6* 7.6*   PT/INR No results for input(s): "LABPROT", "INR" in the last 72 hours. ABG No results for input(s): "PHART", "HCO3" in the last 72 hours.  Invalid input(s): "PCO2", "PO2"  Studies/Results: CT MAXILLOFACIAL WO CONTRAST  Result Date: 03/12/2023 CLINICAL DATA:  Sublingual/submandibular abscess suspected. Injury to the lower jaw. EXAM: CT MAXILLOFACIAL WITHOUT CONTRAST TECHNIQUE: Multidetector CT imaging of the maxillofacial structures was performed. Multiplanar CT image reconstructions were also generated. RADIATION DOSE REDUCTION: This exam was performed according to the departmental dose-optimization program which includes automated exposure control, adjustment of the mA and/or kV according to patient  size and/or use of iterative reconstruction technique. COMPARISON:  None Available. FINDINGS: Osseous: Small osseous fragment adjacent to an area of cortical irregularity about the posterior right mandible (series 4/image 41 and 7/27). No mandibular dislocation. Multiple missing  teeth. Periapical lucencies about multiple remaining teeth compatible with periodontal disease. Orbits: Negative. No traumatic or inflammatory finding. Sinuses: Mucosal thickening in the right maxillary sinus with hyperostosis of the right maxillary sinus walls. Paranasal sinuses are otherwise well aerated. No mastoid effusion. Soft tissues: No soft tissue abscess. Limited intracranial: No acute abnormality. IMPRESSION: 1. Small osseous fragment adjacent to an area of cortical irregularity about the posterior right mandible. This is favored to represent a fracture fragment with possible adjacent osteomyelitis. Direct visualization is recommended. No soft tissue abscess. 2. Periodontal disease. 3. Chronic right maxillary sinusitis Electronically Signed   By: Minerva Fester M.D.   On: 03/12/2023 22:08   CT ABDOMEN PELVIS WO CONTRAST  Result Date: 03/12/2023 CLINICAL DATA:  Several day history of bilateral flank pain associated with mild dysuria EXAM: CT ABDOMEN AND PELVIS WITHOUT CONTRAST TECHNIQUE: Multidetector CT imaging of the abdomen and pelvis was performed following the standard protocol without IV contrast. RADIATION DOSE REDUCTION: This exam was performed according to the departmental dose-optimization program which includes automated exposure control, adjustment of the mA and/or kV according to patient size and/or use of iterative reconstruction technique. COMPARISON:  CT abdomen and pelvis dated 02/22/2017 FINDINGS: Lower chest: No focal consolidation or pulmonary nodule in the lung bases. No pleural effusion or pneumothorax demonstrated. Partially imaged heart size is normal. Coronary artery calcifications. Hepatobiliary: No  focal hepatic lesions. No intra or extrahepatic biliary ductal dilation. Normal gallbladder. Pancreas: Similar prominence of the pancreatic duct measuring 3 mm. Spleen: Normal in size without focal abnormality. Adrenals/Urinary Tract: No adrenal nodules. No suspicious renal mass on this noncontrast enhanced examination or hydronephrosis. Multifocal gaseous foci within the left renal collecting system. 8 mm nonobstructing stone within the left renal pelvis, which demonstrates mural thickening. Intraluminal gas within the urinary bladder. Stomach/Bowel: Metallic radiodensities in the gastric fundus, likely postsurgical. No evidence of bowel wall thickening, distention, or inflammatory changes. Appendix is not discretely seen. Diverticulosis without acute diverticulitis. Normal appendix. Vascular/Lymphatic: Aortic atherosclerosis. No enlarged abdominal or pelvic lymph nodes. Reproductive: No adnexal masses. Other: No free fluid, fluid collection, or free air. Musculoskeletal: No acute or abnormal lytic or blastic osseous lesions. Multilevel degenerative changes of the partially imaged thoracic and lumbar spine. IMPRESSION: 1. Multifocal gaseous foci within the left renal collecting system and urinary bladder, which may be related to gas-forming infection given absent history of recent instrumentation. Recommend correlation with urinalysis and urine cultures. 2. An 8 mm nonobstructing stone within the left renal pelvis. 3. Diverticulosis without acute diverticulitis. 4. Aortic Atherosclerosis (ICD10-I70.0). Coronary artery calcifications. Assessment for potential risk factor modification, dietary therapy or pharmacologic therapy may be warranted, if clinically indicated. These results were called by telephone at the time of interpretation on 03/12/2023 at 4:58 pm to provider [facility] , who verbally acknowledged these results. Electronically Signed   By: Agustin Cree M.D.   On: 03/12/2023 17:00   DG Chest  Portable 1 View  Result Date: 03/12/2023 CLINICAL DATA:  Several day history of bilateral flank pain EXAM: PORTABLE CHEST 1 VIEW COMPARISON:  Chest radiograph dated 02/22/2017 FINDINGS: Mildly low lung volumes. No focal consolidations. No pleural effusion or pneumothorax. The heart size and mediastinal contours are within normal limits. Severe degenerative changes of the bilateral shoulders. Irregular ossification is again seen projecting over the right scapula stable over multiple prior studies. IMPRESSION: No active disease. Electronically Signed   By: Agustin Cree M.D.   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  -WBC count has improved from 44.8 upon admission to 14.8 this a.m.  -Serum creatinine has improved from 2.9 upon admission to 0.94 this a.m.  -Urine output has significantly increased over the last 24 hours  -She is off pressors  Plan: -Continue maximum bladder drainage with Foley catheter until patient has clinical improvement -Continue broad-spectrum antibiotic coverage until cultures are available, will need to be on 2 weeks of culture appropriate antibiotics -No plans for stent placement at this time -Will address the nonobstructing left pelvic renal stone on an outpatient basis -Will need follow-up appoint with urology upon discharge   LOS: 2 days    [facility] PA- C  03/14/2023

## 2023-03-14 NOTE — Progress Notes (Signed)
ANTICOAGULATION CONSULT NOTE  Pharmacy Consult for Heparin  Indication: atrial fibrillation  Allergies  Allergen Reactions   Erythromycin Rash    Patient Measurements: Height: 5\' 3"  (160 cm) Weight: 56.3 kg (124 lb 1.9 oz) IBW/kg (Calculated) : 52.4 Heparin Dosing Weight:  56.3 kg   Vital Signs: Temp: 97.9 F (36.6 C) (09/27 0800) Temp Source: Oral (09/27 0800) BP: 157/102 (09/27 1200) Pulse Rate: 88 (09/27 1200)  Labs: Recent Labs    03/12/23 1446 03/12/23 1542 03/12/23 2137 03/13/23 0633 03/14/23 0309 03/14/23 0719 03/14/23 1632  HGB 13.6  --   --  11.4* 10.5*  --   --   HCT 40.6  --   --  32.8* 30.6*  --   --   PLT 198  --   --  147* 104*  --   --   HEPARINUNFRC  --   --   --   --   --  0.29* 0.27*  CREATININE 2.19*  --   --  1.37* 0.94  --   --   TROPONINIHS  --  20* 10  --   --   --   --     Estimated Creatinine Clearance: 41.5 mL/min (by C-G formula based on SCr of 0.94 mg/dL).   Medical History: Past Medical History:  Diagnosis Date   Kidney stone    Renal disorder    kidney stones    Medications:  Medications Prior to Admission  Medication Sig Dispense Refill Last Dose   latanoprost (XALATAN) 0.005 % ophthalmic solution  (Patient not taking: Reported on 03/12/2023)   Not Taking   ondansetron (ZOFRAN ODT) 4 MG disintegrating tablet Take 1 tablet (4 mg total) by mouth every 8 (eight) hours as needed for nausea or vomiting. (Patient not taking: Reported on 03/12/2023) 15 tablet 0 Not Taking   ondansetron (ZOFRAN) 4 MG tablet Take 1 tablet (4 mg total) by mouth daily as needed. 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    0927 0719 HL 0.29 0927 1632 HL 0.27  Goal of Therapy:  Heparin level 0.3-0.7 units/ml Monitor platelets by anticoagulation protocol: Yes    Plan:  Heparin level is subtherapeutic. Will give heparin bolus of 900 units x 1 and increase heparin infusion to 1100 units/hr. Recheck heparin level in 8 hours. CBC daily while on heparin.   Paschal Dopp, PharmD, BCPS Clinical Pharmacist  03/14/2023 4:59 PM

## 2023-03-14 NOTE — Plan of Care (Signed)
  Problem: Education: Goal: Knowledge of General Education information will improve Description Including pain rating scale, medication(s)/side effects and non-pharmacologic comfort measures Outcome: Progressing   Problem: Health Behavior/Discharge Planning: Goal: Ability to manage health-related needs will improve Outcome: Progressing   Problem: Clinical Measurements: Goal: Ability to maintain clinical measurements within normal limits will improve Outcome: Progressing Goal: Will remain free from infection Outcome: Progressing Goal: Diagnostic test results will improve Outcome: Progressing Goal: Respiratory complications will improve Outcome: Progressing Goal: Cardiovascular complication will be avoided Outcome: Progressing   Problem: Activity: Goal: Risk for activity intolerance will decrease Outcome: Progressing   Problem: Pain Managment: Goal: General experience of comfort will improve Outcome: Progressing   Problem: Safety: Goal: Ability to remain free from injury will improve Outcome: Progressing   Problem: Skin Integrity: Goal: Risk for impaired skin integrity will decrease Outcome: Progressing   Problem: Fluid Volume: Goal: Hemodynamic stability will improve Outcome: Progressing   Problem: Clinical Measurements: Goal: Diagnostic test results will improve Outcome: Progressing Goal: Signs and symptoms of infection will decrease Outcome: Progressing   Problem: Respiratory: Goal: Ability to maintain adequate ventilation will improve Outcome: Progressing

## 2023-03-14 NOTE — Progress Notes (Signed)
  Echocardiogram 2D Echocardiogram has been performed.  Yvette Woodward 03/14/2023, 1:58 PM

## 2023-03-14 NOTE — Progress Notes (Signed)
ANTICOAGULATION CONSULT NOTE - Initial Consult  Pharmacy Consult for Heparin  Indication: atrial fibrillation  Allergies  Allergen Reactions   Erythromycin Rash    Patient Measurements: Height: 5\' 3"  (160 cm) Weight: 56.3 kg (124 lb 1.9 oz) IBW/kg (Calculated) : 52.4 Heparin Dosing Weight:  56.3 kg   Vital Signs: Temp: 98.5 F (36.9 C) (09/27 0200) Temp Source: Oral (09/27 0200) BP: 133/85 (09/27 0600) Pulse Rate: 96 (09/27 0600)  Labs: Recent Labs    03/12/23 1446 03/12/23 1542 03/12/23 2137 03/13/23 0633 03/14/23 0309 03/14/23 0719  HGB 13.6  --   --  11.4* 10.5*  --   HCT 40.6  --   --  32.8* 30.6*  --   PLT 198  --   --  147* 104*  --   HEPARINUNFRC  --   --   --   --   --  0.29*  CREATININE 2.19*  --   --  1.37* 0.94  --   TROPONINIHS  --  20* 10  --   --   --     Estimated Creatinine Clearance: 41.5 mL/min (by C-G formula based on SCr of 0.94 mg/dL).   Medical History: Past Medical History:  Diagnosis Date   Kidney stone    Renal disorder    kidney stones    Medications:  Medications Prior to Admission  Medication Sig Dispense Refill Last Dose   latanoprost (XALATAN) 0.005 % ophthalmic solution  (Patient not taking: Reported on 03/12/2023)   Not Taking   ondansetron (ZOFRAN ODT) 4 MG disintegrating tablet Take 1 tablet (4 mg total) by mouth every 8 (eight) hours as needed for nausea or vomiting. (Patient not taking: Reported on 03/12/2023) 15 tablet 0 Not Taking   ondansetron (ZOFRAN) 4 MG tablet Take 1 tablet (4 mg total) by mouth daily as needed. 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    Goal of Therapy:  Heparin level 0.3-0.7 units/ml Monitor platelets by anticoagulation protocol: Yes   Plan: heparin level subtherapeutic Give 850 units bolus x  1 Increase heparin infusion to 900 units/hr Check anti-Xa level in 8 hours and daily while on heparin Continue to monitor H&H and platelets    Elliot Gurney, PharmD, BCPS Clinical Pharmacist  03/14/2023 8:02 AM

## 2023-03-14 NOTE — Progress Notes (Signed)
NAME:  Yvette Woodward, MRN:  [id number], DOB:  October 11, 1945, LOS: 2 ADMISSION DATE:  03/12/2023, CONSULTATION DATE:  03/12/23 REFERRING MD:  Dr. Marisa Severin, CHIEF COMPLAINT:  Bilateral abdominal/flank pain   History of Present Illness:  77 yo M presenting to [facility] ED from home for evaluation of bilateral lower abdominal/flank pain.  History obtained per chart review and patient bedside report as well as phone conversation with her sister, Yvette Woodward. Patient reports bilateral lower abdominal/flank pain over the last couple of days with associated nausea/vomiting, chills, sore throat and non-productive cough. She also endorses dysuria & polyuria. She denies fever, diarrhea, recent night sweats, fatigue, shortness of breath or chest pain. Of note the patient has noticeable poor dental hygiene. There is a hard mass present on the lower gum line that the patient states is an "old tooth". The area is tender on light palpation, the patient is unclear on the timeline of this mass. Sister reports that family has been encouraging the patient to see a dentist for the last 20 years or so. Patient also has tinnitus, this is chronic but makes communication intermittently challenging. Patient denies having a PCP or seeing a doctor for the last several years, she is also not on any chronic medications. She does not take any medications chronically except ibuprofen occasionally for shoulder pain. Sister reports the patient as being somewhat reclusive due to anxiety. She does not use tobacco products, ETOH or recreational drugs.  ED course: Upon arrival the patient was alert and responsive, with mild tachycardia and hypotension. Sepsis protocol initiated. Labs significant for AKI, AGMA, severe lactic acidosis with significant leukocytosis and left shift. CT abdomen/pelvis demonstrates emphysematous cystitis. EDP spoke with urology on call, who recommended foley catheter insertion if Cr elevated and treat for UTI. After IVF  resuscitation patient's BP marginal. PCCM consulted due to concern for need of vasopressor support. Medications given: ceftriaxone, 2 L LR bolus, morphine, zofran Initial Vitals: 97.8, 18, 105, 89/58 & 100% on RA Significant labs: (Labs/ Imaging personally reviewed) I, Cheryll Cockayne Rust-Chester, AGACNP-BC, personally viewed and interpreted this ECG. EKG Interpretation: Date: 03/12/23, EKG Time: 14:42, Rate: 119, Rhythm: ST, QRS Axis:  normal, Intervals: LAFB, ST/T Wave abnormalities: none, Narrative Interpretation: ST Chemistry: Na+: 137, K+: 3.6, BUN/Cr.: 30/2.19, Serum CO2/ AG: 15/18 Hematology: WBC: 44.8, Hgb: 13.6,  Troponin: 20, Lactic/ PCT: 6.0 > 2.2/ pending UA:  +mod Hgb, +5 ketones, + mod leuks +proteinuria, > 50 WBC's CXR 03/12/23: no active cardiopulmonary disease. Severe degenerative changes in the bilateral shoulders CT abdomen/ pelvis wo contrast 03/12/23:  Multifocal gaseous foci within the left renal collecting system and urinary bladder, which may be related to gas-forming infection given absent history of recent instrumentation. Recommend correlation with urinalysis and urine cultures. An 8 mm nonobstructing stone within the left renal pelvis. Diverticulosis without acute diverticulitis. Aortic Atherosclerosis.  PCCM consulted for admission due to severe sepsis secondary to emphysematous cystitis with AKI and AGMA and potential need for vasopressor support.  Pertinent  Medical History  Kidney Stones Diverticulosis Glaucoma  Significant Hospital Events: Including procedures, antibiotic start and stop dates in addition to other pertinent events   03/12/23: Admit to ICU with severe sepsis secondary to emphysematous cystitis with AKI and AGMA and potential need for vasopressor support. 03/14/23- patient is off vasopressor support and is clinically improved. Micro is still negative.  She is being optimized for downgrade to [facility].   Interim History / Subjective:  Patient alert and  responsive, plan of care  discussed - all questions answered at this time. - BP hypotensive, peripheral levophed ordered  Objective   Blood pressure (!) 121/102, pulse 77, temperature 97.9 F (36.6 C), temperature source Oral, resp. rate 14, height 5\' 3"  (1.6 m), weight 56.3 kg, SpO2 94%.        Intake/Output Summary (Last 24 hours) at 03/14/2023 0950 Last data filed at 03/14/2023 0900 Gross per 24 hour  Intake 1598.33 ml  Output 1125 ml  Net 473.33 ml   Filed Weights   03/12/23 1926 03/13/23 0117  Weight: 59 kg 56.3 kg    Examination: General: Adult female, critically ill, lying in bed, NAD HEENT: MM pink/moist, anicteric, atraumatic, neck supple, dental decay with mass on lower gumline   Neuro: A&O x 4, able to follow commands, PERRL +3, MAE CV: s1s2 RRR, ST on monitor, no r/m/g Pulm: Regular, non labored on RA , breath sounds clear-BUL & clear/diminished-BLL GI: soft, rounded, non tender, bs x 4 GU: foley in place with cloudy yellow urine Skin: gumline mass- see above no other rashes/lesions noted Extremities: warm/dry, pulses + 2 R/P, trace edema noted BLE  Resolved Hospital Problem list     Assessment & Plan:  Severe Sepsis due to 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 Circulatory Shock Initial interventions/workup included: 2 L of NS/LR & Ceftriaxone - Supplemental oxygen as needed, to maintain SpO2 > 90% - f/u cultures, trend lactic/ PCT - Daily CBC, monitor WBC/ fever curve - IV antibiotics: meropenem - MRSA screening ordered - COVID swab ordered due to possible viral respiratory infection (cough/sore throat) - IVF hydration as needed: additional 1 L bolus ordered as lactic > 2 with marginal BP - Peripheral vasopressors to maintain MAP< 65: norepinephrine - urology consulted, appreciate input  Acute Kidney Injury suspect secondary to sepsis and emphysematous cystitis Non-obstructing 8 mm stone Baseline Cr: 0.9 (2018), Cr on admission: 2.19 - Strict I/O's: alert  provider if UOP < 0.5 mL/kg/hr - gentle IVF hydration > sodium bicarbonate drip ordered - Daily BMP, replace electrolytes PRN - pain control with low dose Dilaudid due to AKI - Avoid nephrotoxic agents as able, ensure adequate renal perfusion  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6 32.8* 30.6*  MCV 94.0 89.9 90.5  PLT 198 147* 104*    Basic Metabolic Panel: Recent Labs  Lab 03/12/23 1446 03/12/23 1954 03/13/23 0633 03/14/23 0309  NA 137  --  136 139  K 3.6  --  3.5 3.5  CL 104  --  101 105  CO2 15*  --  24 25  GLUCOSE 126*  --  118* 90  BUN 30*  --  35* 24*  CREATININE 2.19*  --  1.37* 0.94  CALCIUM 8.7*  --  7.6* 7.6*  MG  --  1.4* 2.3 2.2  PHOS  --  3.4 3.3 2.4*   GFR:  Estimated Creatinine Clearance: 41.5 mL/min (by C-G formula based on SCr of 0.94 mg/dL). Recent Labs  Lab 03/12/23 1446 03/12/23 1542 03/12/23 1810 03/12/23 1954 03/12/23 2137 03/13/23 0633 03/14/23 0309  PROCALCITON  --   --   --  40.14  --  32.82 14.18  WBC 44.8*  --   --   --   --  32.7* 14.8*  LATICACIDVEN  --  6.0* 2.2*  --  1.2  --   --     Liver Function Tests: Recent Labs  Lab 03/13/23 0633 03/14/23 0309  ALBUMIN 2.7* 2.3*   No results for input(s): "LIPASE", "AMYLASE" in the last 168 hours. No results for input(s): "AMMONIA" in the last 168 hours.  ABG No  results found for: "PHART", "PCO2ART", "PO2ART", "HCO3", "TCO2", "ACIDBASEDEF", "O2SAT"   Coagulation Profile: No results for input(s): "INR", "PROTIME" in the last 168 hours.  Cardiac Enzymes: No results for input(s): "CKTOTAL", "CKMB", "CKMBINDEX", "TROPONINI" in the last 168 hours.  HbA1C: No results found for: "HGBA1C"  CBG: Recent Labs  Lab 03/13/23 0733 03/13/23 1125 03/13/23 1609 03/13/23 1939 03/14/23 0029  GLUCAP 115* 117* 99 96 92    Review of Systems: Positives in BOLD  Gen: Denies fever, chills, weight change, fatigue, night sweats HEENT: Denies blurred vision, double vision, hearing loss, tinnitus, sinus congestion, rhinorrhea, sore throat, neck stiffness, dysphagia PULM: Denies shortness of breath, cough, sputum production, hemoptysis, wheezing CV: Denies chest pain, edema, orthopnea, paroxysmal nocturnal dyspnea, palpitations GI: Denies abdominal pain, nausea, vomiting, diarrhea, hematochezia, melena, constipation, change in bowel habits GU: Denies dysuria, hematuria, polyuria, oliguria, urethral discharge, flank pain Endocrine: Denies hot or cold intolerance, polyuria, polyphagia or appetite change Derm: Denies rash, dry skin, scaling or peeling skin change Heme: Denies easy bruising, bleeding, bleeding gums Neuro: Denies headache, numbness, weakness, slurred speech, loss of memory or consciousness  Past Medical History:  She,  has a past medical history of Kidney stone and Renal disorder.   Surgical History:   Past Surgical History:  Procedure Laterality Date   LITHOTRIPSY     STOMACH SURGERY       Social History:   reports that she has never smoked. She has never used smokeless tobacco. She reports current alcohol use.   Family History:  Her family history is not on file.   Allergies Allergies  Allergen Reactions   Erythromycin Rash     Home Medications  Prior to Admission medications   Medication Sig Start Date End Date Taking? Authorizing  Provider  latanoprost (XALATAN) 0.005 % ophthalmic solution  02/22/17   [provider]  ondansetron (ZOFRAN ODT) 4 MG disintegrating tablet Take 1 tablet (4 mg total) by mouth every 8 (eight) hours as needed for nausea or vomiting. Patient not taking: Reported on 03/12/2023 02/22/17   Bridget Hartshorn L, PA-C  ondansetron (ZOFRAN) 4 MG tablet Take 1 tablet (4 mg total) by mouth daily as needed. Patient not taking: Reported on 03/12/2023 09/03/16   Myrna Blazer, MD     Critical care provider statement:   Total critical care time: 33 minutes   Performed by: Karna Christmas MD   Critical care time was exclusive of separately billable procedures and treating other patients.   Critical care was necessary to treat or prevent imminent or life-threatening deterioration.   Critical care was time spent personally by me on the following activities: development of treatment plan with patient and/or surrogate as well as nursing, discussions with consultants, evaluation of patient's response to  treatment, examination of patient, obtaining history from patient or surrogate, ordering and performing treatments and interventions, ordering and review of laboratory studies, ordering and review of radiographic studies, pulse oximetry and re-evaluation of patient's condition.    Vida Rigger, M.D.  Pulmonary & Critical Care Medicine

## 2023-03-14 NOTE — Progress Notes (Signed)
Patient noted to have converted from Afib to NSR around 10:24 am.

## 2023-03-14 NOTE — Consult Note (Signed)
PHARMACY CONSULT NOTE  Pharmacy Consult for Electrolyte Monitoring and Replacement   Recent Labs: Potassium (mmol/L)  Date Value  03/14/2023 3.5   Magnesium (mg/dL)  Date Value  16/03/9603 2.2   Calcium (mg/dL)  Date Value  54/02/8118 7.6 (L)   Albumin (g/dL)  Date Value  14/78/2956 2.3 (L)   Phosphorus (mg/dL)  Date Value  21/30/8657 2.4 (L)   Sodium (mmol/L)  Date Value  03/14/2023 139   Assessment: 77 y/o F with medical history including kidney stones, diverticulosis, glaucoma admitted with emphysematous cystitis complicated by septic shock. Pharmacy consulted to assist with electrolyte monitoring and replacement as indicated.  Goal of Therapy:  Electrolytes within normal limits  Plan:  --No electrolyte replacement indicated at this time --Continue to monitor phosphorus and consider replacement if continues to downtrend --Follow-up electrolytes with AM labs tomorrow   Elliot Gurney, PharmD, BCPS Clinical Pharmacist  03/14/2023 8:11 AM

## 2023-03-14 NOTE — Evaluation (Addendum)
Occupational Therapy Evaluation Patient Details Name: Yvette Woodward MRN: [id number] DOB: 17-Mar-1946 Today's Date: 03/14/2023   History of Present Illness Pt is a 77 yo female that presented to the ED for abdominal/flank pain, AMS. 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 Unremarkable PMH.   Clinical Impression   Pt was seen for OT evaluation this date. Prior to hospital admission, pt was living alone and independent. She plans to move in with her sister upon DC from the hospital.  Pt presents to acute OT demonstrating impaired ADL performance and functional mobility 2/2 limitations in strength, balance and endurance/activity tolerance (See OT problem list for additional functional deficits). Pt currently requires CGA fro STS from recliner to RW and for functional/ADL mobility ~10-15 feet within her room. Pt wished to return to bed due to her bottom hurting from sitting in the recliner. She required Min A for sit to supine for BLE management. RR up to 35 at most during mobility, 02 and HR WFL for activity. BP slightly elevated at 160/90's following activity, but pt with no symptoms/complaints. Pt would benefit from skilled OT services to address noted impairments and functional limitations (see below for any additional details) in order to maximize safety and independence while minimizing falls risk and caregiver burden.     If plan is discharge home, recommend the following: A little help with walking and/or transfers;A little help with bathing/dressing/bathroom;Assistance with cooking/housework;Assist for transportation    Functional Status Assessment  Patient has had a recent decline in their functional status and demonstrates the ability to make significant improvements in function in a reasonable and predictable amount of time.  Equipment Recommendations  Tub/shower seat    Recommendations for Other Services       Precautions / Restrictions  Precautions Precautions: Fall Precaution Comments: watch BP Restrictions Weight Bearing Restrictions: No      Mobility Bed Mobility Overal bed mobility: Needs Assistance Bed Mobility: Sit to Supine       Sit to supine: Min assist   General bed mobility comments: Min A for BLE management back into bed    Transfers Overall transfer level: Needs assistance Equipment used: Rolling walker (2 wheels) Transfers: Sit to/from Stand Sit to Stand: Contact guard assist           General transfer comment: STS from recliner with CGA to RW and 10-15 feet ambulation in room with CGA      Balance Overall balance assessment: Needs assistance Sitting-balance support: Feet supported Sitting balance-Leahy Scale: Good     Standing balance support: Bilateral upper extremity supported, During functional activity Standing balance-Leahy Scale: Fair                             ADL either performed or assessed with clinical judgement   ADL Overall ADL's : Needs assistance/impaired                           Toilet Transfer Details (indicate cue type and reason): simulated toilet transfer from recliener to bed would assume CGA/SBA for safety           General ADL Comments: simulated toilet transfer from recliner to bed would assume CGA/SBA for safety     Vision         Perception         Praxis         Pertinent  Vitals/Pain Pain Assessment Pain Assessment: No/denies pain     Extremity/Trunk Assessment Upper Extremity Assessment Upper Extremity Assessment: Overall WFL for tasks assessed   Lower Extremity Assessment Lower Extremity Assessment: Generalized weakness       Communication     Cognition Arousal: Alert Behavior During Therapy: WFL for tasks assessed/performed Overall Cognitive Status: Within Functional Limits for tasks assessed                                 General Comments: talkative     General Comments  pt  felt as if her peri-region was wet, however it was not.    Exercises     Shoulder Instructions      Home Living Family/patient expects to be discharged to:: Private residence Living Arrangements: Alone Available Help at Discharge: Family Type of Home: House Home Access: Level entry     Home Layout: One level     Bathroom Shower/Tub: Runner, broadcasting/film/video: None          Prior Functioning/Environment Prior Level of Function : Independent/Modified Independent             Mobility Comments: previously lived alone, Independent. will be moving in with her sister          OT Problem List: Decreased strength;Impaired balance (sitting and/or standing);Decreased activity tolerance      OT Treatment/Interventions: Self-care/ADL training;Therapeutic exercise;Patient/family education;Energy conservation;Therapeutic activities;DME and/or AE instruction    OT Goals(Current goals can be found in the care plan section) Acute Rehab OT Goals Patient Stated Goal: return home with her sister OT Goal Formulation: With patient Time For Goal Achievement: 03/28/23 Potential to Achieve Goals: Good ADL Goals Pt Will Perform Lower Body Dressing: sit to/from stand;with supervision Pt Will Transfer to Toilet: with supervision;regular height toilet Additional ADL Goal #1: Pt will demo/verbalize use of energy conservation/compensatory strategies during ADL tasks/mobility to promote improved activity tolerance.  OT Frequency: Min 1X/week    Co-evaluation              AM-PAC OT "6 Clicks" Daily Activity     Outcome Measure Help from another person eating meals?: None Help from another person taking care of personal grooming?: None Help from another person toileting, which includes using toliet, bedpan, or urinal?: A Little Help from another person bathing (including washing, rinsing, drying)?: A Little Help from another person to put on and taking off regular  upper body clothing?: None Help from another person to put on and taking off regular lower body clothing?: A Little 6 Click Score: 21   End of Session Equipment Utilized During Treatment: Rolling walker (2 wheels) Nurse Communication: Mobility status  Activity Tolerance: Patient tolerated treatment well Patient left: in bed;with bed alarm set;with call bell/phone within reach  OT Visit Diagnosis: Unsteadiness on feet (R26.81);Other abnormalities of gait and mobility (R26.89);Muscle weakness (generalized) (M62.81)                Time: 4098-1191 OT Time Calculation (min): 24 min Charges:  OT General Charges $OT Visit: 1 Visit OT Evaluation $OT Eval Moderate Complexity: 1 Mod OT Treatments $Therapeutic Activity: 8-22 mins  Alexandrina Fiorini, OTR/L 03/14/23, 4:33 PM Ludell Zacarias E Aasha Dina 03/14/2023, 4:30 PM

## 2023-03-14 NOTE — Consult Note (Signed)
Cardiology Consultation   Patient ID: RUDY DOMEK MRN: [id number]; DOB: [birth year]  Admit date: 03/12/2023 Date of Consult: 03/14/2023  PCP:  Dortha Kern, MD    [facility] Providers Cardiologist: New  Patient Profile:   Yvette Woodward is a 77 y.o. female with a hx of no regular medical care who is being seen 03/14/2023 for the evaluation of new onset afib at the request of Dr. Karna Christmas.  History of Present Illness:   Ms. Antunes has not been seen by cardiology in the past. She does not regularly see PCP. NO significant alcohol, drug or tobacco history. Says her mom and sister have afib.   The patient presented to the ER with abdominal and flank pain. The patient reports her sister brought her to the ER with confusion and abdominal/flank pain. Symptoms were going on for a while.her sister noted she was confused over the phone and patient was brought to the ER. The patient denies chest pain, SOB, LLE, palpitations. She notes weight loss over the last few months.    In the ER she was mildly tachycardic, hypotensive, afebrile with normal RR. Labs showed NA 137, K 3.6, Scr 2.19, BUN 30, Co2 156, WBC 44.8, Hgb 13.6, LA 2.2. HS trop 20. CXR showed no active disease, CT abdomen/pelvis showed infection left kidney system, 8mm stone non-obstructing in the left renal pelvis. EKG showed ST. UA showed leuks and WBCs. Sepsis protocol was initiated, she was started on IVF and IV abx. She was admitted to ICU.   On 9/26 patient went into afib and cardiology was asked to see.    Past Medical History:  Diagnosis Date   Kidney stone    Renal disorder    kidney stones    Past Surgical History:  Procedure Laterality Date   LITHOTRIPSY     STOMACH SURGERY       Home Medications:  Prior to Admission medications   Medication Sig Start Date End Date Taking? Authorizing Provider  latanoprost (XALATAN) 0.005 % ophthalmic solution  02/22/17   [provider]  ondansetron  (ZOFRAN ODT) 4 MG disintegrating tablet Take 1 tablet (4 mg total) by mouth every 8 (eight) hours as needed for nausea or vomiting. Patient not taking: Reported on 03/12/2023 02/22/17   Bridget Hartshorn L, PA-C  ondansetron (ZOFRAN) 4 MG tablet Take 1 tablet (4 mg total) by mouth daily as needed. Patient not taking: Reported on 03/12/2023 09/03/16   Myrna Blazer, MD    Inpatient Medications: Scheduled Meds:  Chlorhexidine Gluconate Cloth  6 each Topical Daily   feeding supplement  237 mL Oral BID BM   multivitamin with minerals  1 tablet Oral Daily   mouth rinse  15 mL Mouth Rinse 4 times per day   pantoprazole (PROTONIX) IV  40 mg Intravenous Q24H   sodium chloride flush  3 mL Intravenous Q12H   Continuous Infusions:  sodium chloride     sodium chloride Stopped (03/12/23 2112)   heparin 900 Units/hr (03/14/23 1000)   meropenem (MERREM) IV Stopped (03/13/23 2312)   norepinephrine (LEVOPHED) Adult infusion Stopped (03/13/23 1247)   PRN Meds: sodium chloride, acetaminophen, docusate sodium, HYDROmorphone (DILAUDID) injection, ondansetron (ZOFRAN) IV, mouth rinse, polyethylene glycol, sodium chloride flush  Allergies:    Allergies  Allergen Reactions   Erythromycin Rash    Social History:   Social History   Socioeconomic History   Marital status: Single    Spouse name: Not on file   Number of  children: Not on file   Years of education: Not on file   Highest education level: Not on file  Occupational History   Not on file  Tobacco Use   Smoking status: Never   Smokeless tobacco: Never  Vaping Use   Vaping status: Never Used  Substance and Sexual Activity   Alcohol use: Yes    Comment: occasionally   Drug use: Not on file   Sexual activity: Not on file  Other Topics Concern   Not on file  Social History Narrative   Not on file   Social Determinants of Health   Financial Resource Strain: Not on file  Food Insecurity: No Food Insecurity (03/13/2023)   Hunger  Vital Sign    Worried About Running Out of Food in the Last Year: Never true    Ran Out of Food in the Last Year: Never true  Transportation Needs: No Transportation Needs (03/13/2023)   PRAPARE - Administrator, Civil Service (Medical): No    Lack of Transportation (Non-Medical): No  Physical Activity: Not on file  Stress: Not on file  Social Connections: Not on file  Intimate Partner Violence: Not At Risk (03/13/2023)   Humiliation, Afraid, Rape, and Kick questionnaire    Fear of Current or Ex-Partner: No    Emotionally Abused: No    Physically Abused: No    Sexually Abused: No    Family History:   History reviewed. No pertinent family history.   ROS:  Please see the history of present illness.   All other ROS reviewed and negative.     Physical Exam/Data:   Vitals:   03/14/23 0400 03/14/23 0600 03/14/23 0800 03/14/23 1000  BP: 123/82 133/85 (!) 121/102 (!) 143/84  Pulse: 85 96 77 76  Resp: 14 19 14 16   Temp:   97.9 F (36.6 C)   TempSrc:   Oral   SpO2: 95% 95% 94% 95%  Weight:      Height:        Intake/Output Summary (Last 24 hours) at 03/14/2023 1057 Last data filed at 03/14/2023 1000 Gross per 24 hour  Intake 1607.19 ml  Output 1050 ml  Net 557.19 ml      03/13/2023    1:17 AM 03/12/2023    7:26 PM 02/26/2017    9:21 AM  Last 3 Weights  Weight (lbs) 124 lb 1.9 oz 130 lb 127 lb  Weight (kg) 56.3 kg 58.968 kg 57.607 kg     Body mass index is 21.99 kg/m.  General:  frail elderly female HEENT: normal Neck: no JVD Vascular: No carotid bruits; Distal pulses 2+ bilaterally Cardiac:  normal S1, S2; RRR; no murmur  Lungs:  clear to auscultation bilaterally, no wheezing, rhonchi or rales  Abd: soft, nontender, no hepatomegaly  Ext: no edema Musculoskeletal:  No deformities, BUE and BLE strength normal and equal Skin: warm and dry  Neuro:  CNs 2-12 intact, no focal abnormalities noted Psych:  Normal affect   EKG:  The EKG was personally reviewed  and demonstrates:  afib, 88bpm, IVCD Telemetry:  Telemetry was personally reviewed and demonstrates:  NSR>Pafib 80s, PVCs>NSR  Relevant CV Studies:  Echo ordered  Laboratory Data:  High Sensitivity Troponin:   Recent Labs  Lab 03/12/23 1542 03/12/23 2137  TROPONINIHS 20* 10     Chemistry Recent Labs  Lab 03/12/23 1446 03/12/23 1954 03/13/23 0633 03/14/23 0309  NA 137  --  136 139  K 3.6  --  3.5  3.5  CL 104  --  101 105  CO2 15*  --  24 25  GLUCOSE 126*  --  118* 90  BUN 30*  --  35* 24*  CREATININE 2.19*  --  1.37* 0.94  CALCIUM 8.7*  --  7.6* 7.6*  MG  --  1.4* 2.3 2.2  GFRNONAA 23*  --  40* >60  ANIONGAP 18*  --  11 9    Recent Labs  Lab 03/13/23 0633 03/14/23 0309  ALBUMIN 2.7* 2.3*   Lipids No results for input(s): "CHOL", "TRIG", "HDL", "LABVLDL", "LDLCALC", "CHOLHDL" in the last 168 hours.  Hematology Recent Labs  Lab 03/12/23 1446 03/13/23 0633 03/14/23 0309  WBC 44.8* 32.7* 14.8*  RBC 4.32 3.65* 3.38*  HGB 13.6 11.4* 10.5*  HCT 40.6 32.8* 30.6*  MCV 94.0 89.9 90.5  MCH 31.5 31.2 31.1  MCHC 33.5 34.8 34.3  RDW 13.3 13.5 13.2  PLT 198 147* 104*   Thyroid  Recent Labs  Lab 03/13/23 2225  TSH 2.482  FREET4 1.08    BNPNo results for input(s): "BNP", "PROBNP" in the last 168 hours.  DDimer No results for input(s): "DDIMER" in the last 168 hours.   Radiology/Studies:  CT MAXILLOFACIAL WO CONTRAST  Result Date: 03/12/2023 CLINICAL DATA:  Sublingual/submandibular abscess suspected. Injury to the lower jaw. EXAM: CT MAXILLOFACIAL WITHOUT CONTRAST TECHNIQUE: Multidetector CT imaging of the maxillofacial structures was performed. Multiplanar CT image reconstructions were also generated. RADIATION DOSE REDUCTION: This exam was performed according to the departmental dose-optimization program which includes automated exposure control, adjustment of the mA and/or kV according to patient size and/or use of iterative reconstruction technique.  COMPARISON:  None Available. FINDINGS: Osseous: Small osseous fragment adjacent to an area of cortical irregularity about the posterior right mandible (series 4/image 41 and 7/27). No mandibular dislocation. Multiple missing teeth. Periapical lucencies about multiple remaining teeth compatible with periodontal disease. Orbits: Negative. No traumatic or inflammatory finding. Sinuses: Mucosal thickening in the right maxillary sinus with hyperostosis of the right maxillary sinus walls. Paranasal sinuses are otherwise well aerated. No mastoid effusion. Soft tissues: No soft tissue abscess. Limited intracranial: No acute abnormality. IMPRESSION: 1. Small osseous fragment adjacent to an area of cortical irregularity about the posterior right mandible. This is favored to represent a fracture fragment with possible adjacent osteomyelitis. Direct visualization is recommended. No soft tissue abscess. 2. Periodontal disease. 3. Chronic right maxillary sinusitis Electronically Signed   By: Minerva Fester M.D.   On: 03/12/2023 22:08   CT ABDOMEN PELVIS WO CONTRAST  Result Date: 03/12/2023 CLINICAL DATA:  Several day history of bilateral flank pain associated with mild dysuria EXAM: CT ABDOMEN AND PELVIS WITHOUT CONTRAST TECHNIQUE: Multidetector CT imaging of the abdomen and pelvis was performed following the standard protocol without IV contrast. RADIATION DOSE REDUCTION: This exam was performed according to the departmental dose-optimization program which includes automated exposure control, adjustment of the mA and/or kV according to patient size and/or use of iterative reconstruction technique. COMPARISON:  CT abdomen and pelvis dated 02/22/2017 FINDINGS: Lower chest: No focal consolidation or pulmonary nodule in the lung bases. No pleural effusion or pneumothorax demonstrated. Partially imaged heart size is normal. Coronary artery calcifications. Hepatobiliary: No focal hepatic lesions. No intra or extrahepatic biliary  ductal dilation. Normal gallbladder. Pancreas: Similar prominence of the pancreatic duct measuring 3 mm. Spleen: Normal in size without focal abnormality. Adrenals/Urinary Tract: No adrenal nodules. No suspicious renal mass on this noncontrast enhanced examination or hydronephrosis. Multifocal gaseous foci  within the left renal collecting system. 8 mm nonobstructing stone within the left renal pelvis, which demonstrates mural thickening. Intraluminal gas within the urinary bladder. Stomach/Bowel: Metallic radiodensities in the gastric fundus, likely postsurgical. No evidence of bowel wall thickening, distention, or inflammatory changes. Appendix is not discretely seen. Diverticulosis without acute diverticulitis. Normal appendix. Vascular/Lymphatic: Aortic atherosclerosis. No enlarged abdominal or pelvic lymph nodes. Reproductive: No adnexal masses. Other: No free fluid, fluid collection, or free air. Musculoskeletal: No acute or abnormal lytic or blastic osseous lesions. Multilevel degenerative changes of the partially imaged thoracic and lumbar spine. IMPRESSION: 1. Multifocal gaseous foci within the left renal collecting system and urinary bladder, which may be related to gas-forming infection given absent history of recent instrumentation. Recommend correlation with urinalysis and urine cultures. 2. An 8 mm nonobstructing stone within the left renal pelvis. 3. Diverticulosis without acute diverticulitis. 4. Aortic Atherosclerosis (ICD10-I70.0). Coronary artery calcifications. Assessment for potential risk factor modification, dietary therapy or pharmacologic therapy may be warranted, if clinically indicated. These results were called by telephone at the time of interpretation on 03/12/2023 at 4:58 pm to provider [facility] , who verbally acknowledged these results. Electronically Signed   By: Agustin Cree M.D.   On: 03/12/2023 17:00   DG Chest Portable 1 View  Result Date: 03/12/2023 CLINICAL DATA:   Several day history of bilateral flank pain EXAM: PORTABLE CHEST 1 VIEW COMPARISON:  Chest radiograph dated 02/22/2017 FINDINGS: Mildly low lung volumes. No focal consolidations. No pleural effusion or pneumothorax. The heart size and mediastinal contours are within normal limits. Severe degenerative changes of the bilateral shoulders. Irregular ossification is again seen projecting over the right scapula stable over multiple prior studies. IMPRESSION: No active disease. Electronically Signed   By: Agustin Cree M.D.   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, shock, AKI - back in NSR - patient is overall asymptomatic in afib - CHADSVASC at least 68 (female, age x2, PAD) she will require longterm a/c - continue IV heparin for now - can start low dose BB for rate control, she is off pressors - keep Mag>2 and K>4 - TSH wnl - check echo  Severe sepsis 2/2 cystitis - IVF and IV abx per [facility]  Renal stone - nonobstructive stone - urology following, plan for conservative management  AKI - improving  For questions or updates, please contact [facility] Please consult [email or web address] for contact info under    Signed, Ilario Dhaliwal David Stall, PA-C  03/14/2023 10:57 AM

## 2023-03-14 NOTE — Progress Notes (Signed)
Report called to Alphonse Guild, receiving care nurse for room 120

## 2023-03-14 NOTE — Evaluation (Signed)
Physical Therapy Evaluation Patient Details Name: GWYNETH FERNANDEZ MRN: [id number] DOB: 1945/10/20 Today's Date: 03/14/2023  History of Present Illness  Pt is a 77 yo female that presented to the ED for abdominal/flank pain, AMS. 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 Unremarkable PMH.   Clinical Impression  Pt alert, agreeable to PT oriented x4, denied pain. Pt reported at baseline she is independent, lives alone, but now plans to move in/stay with her sister at discharge.   The patient was able to perform supine to sit with very light minA to assist with BLE. Sit <> stand with RW and CGA, and able to march in place. She ambulated ~43ft with CGA, RW and chair follow. She endorsed some fatigue at end of activity. No LOB noted, BP elevated with mobility (160s/90s) but pt without complaint.  Overall the patient demonstrated deficits (see "PT Problem List") that impede the patient's functional abilities, safety, and mobility and would benefit from skilled PT intervention.          If plan is discharge home, recommend the following: A little help with bathing/dressing/bathroom;Assistance with cooking/housework;Assist for transportation;Help with stairs or ramp for entrance   Can travel by private vehicle        Equipment Recommendations Rolling walker (2 wheels);BSC/3in1  Recommendations for Other Services       Functional Status Assessment Patient has had a recent decline in their functional status and demonstrates the ability to make significant improvements in function in a reasonable and predictable amount of time.     Precautions / Restrictions Precautions Precautions: Fall Precaution Comments: watch BP Restrictions Weight Bearing Restrictions: No      Mobility  Bed Mobility Overal bed mobility: Needs Assistance Bed Mobility: Supine to Sit     Supine to sit: Min assist, HOB elevated     General bed mobility comments: very light  minA for BLE    Transfers Overall transfer level: Needs assistance Equipment used: Rolling walker (2 wheels) Transfers: Sit to/from Stand Sit to Stand: Contact guard assist                Ambulation/Gait Ambulation/Gait assistance: Contact guard assist Gait Distance (Feet): 50 Feet Assistive device: Rolling walker (2 wheels) Gait Pattern/deviations: WFL(Within Functional Limits)          Stairs            Wheelchair Mobility     Tilt Bed    Modified Rankin (Stroke Patients Only)       Balance Overall balance assessment: Needs assistance Sitting-balance support: Feet supported Sitting balance-Leahy Scale: Good     Standing balance support: Bilateral upper extremity supported, During functional activity Standing balance-Leahy Scale: Fair                               Pertinent Vitals/Pain Pain Assessment Pain Assessment: No/denies pain    Home Living Family/patient expects to be discharged to:: Private residence Living Arrangements: Alone Available Help at Discharge: Family Type of Home: House Home Access: Level entry       Home Layout: One level Home Equipment: None      Prior Function Prior Level of Function : Independent/Modified Independent             Mobility Comments: previously lived alone, Independent. will be moving in with her sister       Extremity/Trunk Assessment   Upper Extremity Assessment Upper Extremity  Assessment: Overall WFL for tasks assessed    Lower Extremity Assessment Lower Extremity Assessment: Generalized weakness       Communication      Cognition Arousal: Alert Behavior During Therapy: WFL for tasks assessed/performed Overall Cognitive Status: Within Functional Limits for tasks assessed                                          General Comments      Exercises     Assessment/Plan    PT Assessment Patient needs continued PT services  PT Problem List  Decreased activity tolerance;Decreased balance;Decreased mobility;Decreased strength       PT Treatment Interventions DME instruction;Neuromuscular re-education;Gait training;Stair training;Patient/family education;Functional mobility training;Therapeutic activities;Therapeutic exercise;Balance training    PT Goals (Current goals can be found in the Care Plan section)  Acute Rehab PT Goals Patient Stated Goal: to get stronger PT Goal Formulation: With patient Time For Goal Achievement: 03/28/23 Potential to Achieve Goals: Good    Frequency Min 1X/week     Co-evaluation               AM-PAC PT "6 Clicks" Mobility  Outcome Measure Help needed turning from your back to your side while in a flat bed without using bedrails?: A Little Help needed moving from lying on your back to sitting on the side of a flat bed without using bedrails?: A Little Help needed moving to and from a bed to a chair (including a wheelchair)?: None Help needed standing up from a chair using your arms (e.g., wheelchair or bedside chair)?: None Help needed to walk in hospital room?: A Little Help needed climbing 3-5 steps with a railing? : A Little 6 Click Score: 20    End of Session Equipment Utilized During Treatment: Gait belt Activity Tolerance: Patient tolerated treatment well Patient left: in chair;with call bell/phone within reach;with chair alarm set   PT Visit Diagnosis: Other abnormalities of gait and mobility (R26.89);Muscle weakness (generalized) (M62.81);Difficulty in walking, not elsewhere classified (R26.2)    Time: 2130-8657 PT Time Calculation (min) (ACUTE ONLY): 27 min   Charges:   PT Evaluation $PT Eval Low Complexity: 1 Low PT Treatments $Therapeutic Activity: 8-22 mins PT General Charges $$ ACUTE PT VISIT: 1 Visit         Olga Coaster PT, DPT 3:31 PM,03/14/23

## 2023-03-15 DIAGNOSIS — E538 Deficiency of other specified B group vitamins: Secondary | ICD-10-CM | POA: Insufficient documentation

## 2023-03-15 DIAGNOSIS — N179 Acute kidney failure, unspecified: Secondary | ICD-10-CM | POA: Insufficient documentation

## 2023-03-15 DIAGNOSIS — I4891 Unspecified atrial fibrillation: Secondary | ICD-10-CM | POA: Insufficient documentation

## 2023-03-15 DIAGNOSIS — K047 Periapical abscess without sinus: Secondary | ICD-10-CM | POA: Diagnosis not present

## 2023-03-15 DIAGNOSIS — A419 Sepsis, unspecified organism: Secondary | ICD-10-CM | POA: Diagnosis not present

## 2023-03-15 DIAGNOSIS — D696 Thrombocytopenia, unspecified: Secondary | ICD-10-CM | POA: Insufficient documentation

## 2023-03-15 DIAGNOSIS — E440 Moderate protein-calorie malnutrition: Secondary | ICD-10-CM

## 2023-03-15 DIAGNOSIS — N390 Urinary tract infection, site not specified: Secondary | ICD-10-CM

## 2023-03-15 DIAGNOSIS — I480 Paroxysmal atrial fibrillation: Secondary | ICD-10-CM | POA: Diagnosis not present

## 2023-03-15 DIAGNOSIS — N3080 Other cystitis without hematuria: Secondary | ICD-10-CM | POA: Diagnosis not present

## 2023-03-15 DIAGNOSIS — B9620 Unspecified Escherichia coli [E. coli] as the cause of diseases classified elsewhere: Secondary | ICD-10-CM

## 2023-03-15 LAB — CBC
HCT: 32.4 % — ABNORMAL LOW (ref 36.0–46.0)
Hemoglobin: 11 g/dL — ABNORMAL LOW (ref 12.0–15.0)
MCH: 31.4 pg (ref 26.0–34.0)
MCHC: 34 g/dL (ref 30.0–36.0)
MCV: 92.6 fL (ref 80.0–100.0)
Platelets: 111 10*3/uL — ABNORMAL LOW (ref 150–400)
RBC: 3.5 MIL/uL — ABNORMAL LOW (ref 3.87–5.11)
RDW: 13.1 % (ref 11.5–15.5)
WBC: 7.5 10*3/uL (ref 4.0–10.5)
nRBC: 0 % (ref 0.0–0.2)

## 2023-03-15 LAB — URINE CULTURE: Culture: 30000 — AB

## 2023-03-15 LAB — RENAL FUNCTION PANEL
Albumin: 2.4 g/dL — ABNORMAL LOW (ref 3.5–5.0)
Anion gap: 5 (ref 5–15)
BUN: 14 mg/dL (ref 8–23)
CO2: 25 mmol/L (ref 22–32)
Calcium: 7.7 mg/dL — ABNORMAL LOW (ref 8.9–10.3)
Chloride: 108 mmol/L (ref 98–111)
Creatinine, Ser: 0.63 mg/dL (ref 0.44–1.00)
GFR, Estimated: >60 mL/min (ref >60)
Glucose, Bld: 103 mg/dL — ABNORMAL HIGH (ref 70–99)
Phosphorus: 2.2 mg/dL — ABNORMAL LOW (ref 2.5–4.6)
Potassium: 3.6 mmol/L (ref 3.5–5.1)
Sodium: 138 mmol/L (ref 135–145)

## 2023-03-15 LAB — HEPARIN LEVEL (UNFRACTIONATED)
Heparin Unfractionated: 0.35 [IU]/mL (ref 0.30–0.70)
Heparin Unfractionated: 0.36 [IU]/mL (ref 0.30–0.70)

## 2023-03-15 LAB — T3, FREE: T3, Free: 1.7 pg/mL — ABNORMAL LOW (ref 2.0–4.4)

## 2023-03-15 MED ORDER — SODIUM CHLORIDE 0.9 % IV SOLN
2.0000 g | INTRAVENOUS | Status: DC
  Administered 2023-03-15 – 2023-03-16 (×2): 2 g via INTRAVENOUS
  Filled 2023-03-15 (×2): qty 20

## 2023-03-15 MED ORDER — METOPROLOL SUCCINATE ER 50 MG PO TB24
50.0000 mg | ORAL_TABLET | Freq: Every day | ORAL | Status: DC
  Administered 2023-03-16: 50 mg via ORAL
  Filled 2023-03-15: qty 1

## 2023-03-15 MED ORDER — K PHOS MONO-SOD PHOS DI & MONO 155-852-130 MG PO TABS
500.0000 mg | ORAL_TABLET | Freq: Once | ORAL | Status: AC
  Administered 2023-03-15: 500 mg via ORAL
  Filled 2023-03-15: qty 2

## 2023-03-15 MED ORDER — PANTOPRAZOLE SODIUM 40 MG PO TBEC
40.0000 mg | DELAYED_RELEASE_TABLET | Freq: Every day | ORAL | Status: DC
  Administered 2023-03-15: 40 mg via ORAL
  Filled 2023-03-15: qty 1

## 2023-03-15 MED ORDER — METRONIDAZOLE 500 MG PO TABS
500.0000 mg | ORAL_TABLET | Freq: Two times a day (BID) | ORAL | Status: DC
  Administered 2023-03-15 – 2023-03-16 (×3): 500 mg via ORAL
  Filled 2023-03-15 (×3): qty 1

## 2023-03-15 MED ORDER — METOPROLOL TARTRATE 25 MG PO TABS
12.5000 mg | ORAL_TABLET | Freq: Two times a day (BID) | ORAL | Status: DC
  Administered 2023-03-15: 12.5 mg via ORAL
  Filled 2023-03-15: qty 1

## 2023-03-15 MED ORDER — APIXABAN 5 MG PO TABS
5.0000 mg | ORAL_TABLET | Freq: Two times a day (BID) | ORAL | Status: DC
  Administered 2023-03-15 – 2023-03-16 (×3): 5 mg via ORAL
  Filled 2023-03-15 (×3): qty 1

## 2023-03-15 NOTE — Assessment & Plan Note (Signed)
Resolved

## 2023-03-15 NOTE — Assessment & Plan Note (Signed)
Seen by urology.  Patient will need to be on 2 weeks of antibiotics and follow-up with urology as outpatient for nonobstructing left pelvic renal stone.

## 2023-03-15 NOTE — Progress Notes (Signed)
Mobility Specialist - Progress Note   03/15/23 1138  Mobility  Activity Ambulated with assistance in hallway;Stood at bedside;Dangled on edge of bed;Ambulated with assistance to bathroom  Level of Assistance Contact guard assist, steadying assist  Assistive Device Front wheel walker  Distance Ambulated (ft) 100 ft  Activity Response Tolerated well  Mobility Referral Yes  $Mobility charge 1 Mobility  Mobility Specialist Start Time (ACUTE ONLY) 0944  Mobility Specialist Stop Time (ACUTE ONLY) 1013  Mobility Specialist Time Calculation (min) (ACUTE ONLY) 29 min   Pt supine in bed on RA upon arrival. Pt completes bed mobility indep with extra time. Pt STS and ambulates to/from bathroom and in hallway CGA-SBA. Pt left in recliner with needs in reach and chair alarm activated.   Terrilyn Saver  Mobility Specialist  03/15/23 11:39 AM

## 2023-03-15 NOTE — Assessment & Plan Note (Addendum)
Switch heparin drip over to Eliquis.  Low-dose metoprolol.

## 2023-03-15 NOTE — Progress Notes (Signed)
ANTICOAGULATION CONSULT NOTE  Pharmacy Consult for Heparin  Indication: atrial fibrillation  Allergies  Allergen Reactions   Erythromycin Rash    Patient Measurements: Height: 5\' 3"  (160 cm) Weight: 57.3 kg (126 lb 5.2 oz) IBW/kg (Calculated) : 52.4 Heparin Dosing Weight:  56.3 kg   Vital Signs: Temp: 98.1 F (36.7 C) (09/28 0927) Temp Source: Oral (09/28 0927) BP: 166/99 (09/28 0927) Pulse Rate: 94 (09/28 0927)  Labs: Recent Labs    03/12/23 1542 03/12/23 2137 03/13/23 1610 03/14/23 0309 03/14/23 0719 03/14/23 1632 03/15/23 0236 03/15/23 1050  HGB  --   --  11.4* 10.5*  --   --  11.0*  --   HCT  --   --  32.8* 30.6*  --   --  32.4*  --   PLT  --   --  147* 104*  --   --  111*  --   HEPARINUNFRC  --   --   --   --    < > 0.27* 0.35 0.36  CREATININE  --   --  1.37* 0.94  --   --  0.63  --   TROPONINIHS 20* 10  --   --   --   --   --   --    < > = values in this interval not displayed.    Estimated Creatinine Clearance: 48.7 mL/min (by C-G formula based on SCr of 0.63 mg/dL).   Medical History: Past Medical History:  Diagnosis Date   Kidney stone    Renal disorder    kidney stones    Medications:  Medications Prior to Admission  Medication Sig Dispense Refill Last Dose   latanoprost (XALATAN) 0.005 % ophthalmic solution  (Patient not taking: Reported on 03/12/2023)   Not Taking   ondansetron (ZOFRAN ODT) 4 MG disintegrating tablet Take 1 tablet (4 mg total) by mouth every 8 (eight) hours as needed for nausea or vomiting. (Patient not taking: Reported on 03/12/2023) 15 tablet 0 Not Taking   ondansetron (ZOFRAN) 4 MG tablet Take 1 tablet (4 mg total) by mouth daily as needed. 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    0927 0719 HL 0.29 0927 1632 HL 0.27 0928 0236 HL  0.35 0928 1050 HL 0.36   Goal of Therapy:  Heparin level 0.3-0.7 units/ml Monitor platelets by anticoagulation protocol: Yes   Plan:  Heparin level is therapeutic. Will continue heparin infusion at 1100 units/hr. Recheck heparin level and CBC with AM labs.   Ronnald Ramp, PharmD, BCPS Clinical Pharmacist  03/15/2023 11:57 AM

## 2023-03-15 NOTE — Assessment & Plan Note (Signed)
Spoke with patient and patient's sister that she will need to have all of her teeth removed as outpatient.  Will need to hold Eliquis for at least 2 days prior to procedure.  Patient was on meropenem initially and will switch over to Flagyl upon discharge.

## 2023-03-15 NOTE — Progress Notes (Signed)
ANTICOAGULATION CONSULT NOTE  Pharmacy Consult for Apixaban Indication: atrial fibrillation  Patient Measurements: Height: 5\' 3"  (160 cm) Weight: 57.3 kg (126 lb 5.2 oz) IBW/kg (Calculated) : 52.4  Labs: Recent Labs    03/12/23 1542 03/12/23 2137 03/13/23 0633 03/14/23 0309 03/14/23 0719 03/14/23 1632 03/15/23 0236 03/15/23 1050  HGB  --   --  11.4* 10.5*  --   --  11.0*  --   HCT  --   --  32.8* 30.6*  --   --  32.4*  --   PLT  --   --  147* 104*  --   --  111*  --   HEPARINUNFRC  --   --   --   --    < > 0.27* 0.35 0.36  CREATININE  --   --  1.37* 0.94  --   --  0.63  --   TROPONINIHS 20* 10  --   --   --   --   --   --    < > = values in this interval not displayed.    Estimated Creatinine Clearance: 48.7 mL/min (by C-G formula based on SCr of 0.63 mg/dL).   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    0927 0719 HL 0.29 0927 1632 HL 0.27 0928 0236 HL 0.35 0928 1050 HL 0.36   Goal of Therapy:  Heparin level 0.3-0.7 units/ml Monitor platelets by anticoagulation protocol: Yes   Plan:  --Stop heparin infusion --Start apixaban 5 mg BID --CBC per protocol while admitted  Tressie Ellis 03/15/2023 12:53 PM

## 2023-03-15 NOTE — Assessment & Plan Note (Signed)
Likely secondary to sepsis.

## 2023-03-15 NOTE — Progress Notes (Signed)
Progress Note   Patient: Yvette Woodward WGN:[id number] DOB: 1945/08/28 DOA: 03/12/2023     3 DOS: the patient was seen and examined on 03/15/2023   Brief hospital course: 77 year old female came in with bilateral lower abdominal/flank pain.  Patient was admitted with septic shock, acute kidney injury and had a nonobstructing kidney stone.  Patient had a CT scan of the abdomen pelvis that showed multifocal gaseous foci within the left renal system and urinary bladder and an 8 mm nonobstructing stone in the left renal pelvis.    9/25.  Patient admitted to ICU with septic shock requiring vasopressors pressor support 9/27.  Off vasopressors.  Patient had an episode of atrial fibrillation and started on heparin drip.  Echocardiogram showed an EF of 55%. 9/28.  E. coli growing out of the urine.  Antibiotics switched from meropenem over to Rocephin.  Flagyl also added to cover the mouth.  Assessment and Plan: * Septic shock (HCC) Septic shock, present on admission secondary urinary tract infection with E. coli.  Required vasopressors and ICU support initially.  Lactic acid initially 6.0, white blood cell count 44.8, patient with tachycardia and hypotension.  The E. coli growing out of the urine will switch meropenem over to Rocephin.  Add Flagyl to cover mouth organisms.  Discontinue Foley.  Paroxysmal atrial fibrillation (HCC) Switch heparin drip over to Eliquis.  Low-dose metoprolol.  Dental infection Spoke with patient and patient's Yvette Woodward that she will need to have all of her teeth removed as outpatient.  Will need to hold Eliquis for at least 2 days prior to procedure.  Meropenem switched over to Rocephin to cover E. coli in urine and will add Flagyl to cover mouth organisms.  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  Patient will need to be on 2 weeks of antibiotics and follow-up with urology as outpatient for nonobstructing left pelvic  renal stone.  Vitamin B12 deficiency Supplement B12  Hypomagnesemia Treated during hospital course.  Thrombocytopenia (HCC) Likely secondary to sepsis.        Subjective: Patient seen this morning and feels weak.  Understands that she needs to see a oral surgeon to remove all of her teeth.  Explained benefits and risk of blood thinner.  Admitted with septic shock.  Physical Exam: Vitals:   03/15/23 0427 03/15/23 0500 03/15/23 0927 03/15/23 1214  BP: (!) 148/82  (!) 166/99 (!) 145/93  Pulse: 87  94 77  Resp: 18     Temp: 97.6 F (36.4 C)  98.1 F (36.7 C) 97.8 F (36.6 C)  TempSrc:   Oral   SpO2: 100%  100% 98%  Weight:  57.3 kg    Height:       Physical Exam HENT:     Head: Normocephalic.     Mouth/Throat:     Comments: Poor dentition Eyes:     General: Lids are normal.     Conjunctiva/sclera: Conjunctivae normal.  Cardiovascular:     Rate and Rhythm: Normal rate and regular rhythm.     Heart sounds: Normal heart sounds, S1 normal and S2 normal.  Pulmonary:     Breath sounds: No decreased breath sounds, wheezing, rhonchi or rales.  Abdominal:     Palpations: Abdomen is soft.     Tenderness: There is no abdominal tenderness.  Musculoskeletal:     Right lower leg: No swelling.     Left lower leg: No swelling.  Skin:    General: Skin is warm.  Findings: No rash.  Neurological:     Mental Status: She is alert and oriented to person, place, and time.     Data Reviewed: Creatinine 0.63  Family Communication: Spoke with Yvette Woodward on the phone  Disposition: Status is: Inpatient Remains inpatient appropriate because: Will DC Foley, switch heparin drip to Eliquis, ambulate today, switch meropenem over to Rocephin and Flagyl.  Planned Discharge Destination: Home with Home Health, patient will go home with Yvette Woodward    Time spent: 30 minutes Case discussed with cardiology and pharmacist  Author: Alford Highland, MD 03/15/2023 1:01 PM  For on call review  [email or web address].

## 2023-03-15 NOTE — Progress Notes (Signed)
Rounding Note    Patient Name: Yvette Woodward Date of Encounter: 03/15/2023  [facility] Cardiologist: New  Subjective   Patient remains in NSR. She is overall feeling better. No chest pain or SOB.   Inpatient Medications    Scheduled Meds:  Chlorhexidine Gluconate Cloth  6 each Topical Daily   vitamin B-12  1,000 mcg Oral Daily   feeding supplement  237 mL Oral BID BM   multivitamin with minerals  1 tablet Oral Daily   mouth rinse  15 mL Mouth Rinse 4 times per day   pantoprazole (PROTONIX) IV  40 mg Intravenous Q24H   sodium chloride flush  3 mL Intravenous Q12H   Continuous Infusions:  sodium chloride     sodium chloride Stopped (03/12/23 2112)   heparin 1,100 Units/hr (03/15/23 0302)   meropenem (MERREM) IV Stopped (03/14/23 2354)   PRN Meds: sodium chloride, acetaminophen, docusate sodium, HYDROmorphone (DILAUDID) injection, ondansetron (ZOFRAN) IV, mouth rinse, polyethylene glycol, sodium chloride flush   Vital Signs    Vitals:   03/14/23 2015 03/14/23 2341 03/15/23 0427 03/15/23 0500  BP: (!) 156/87 (!) 140/76 (!) 148/82   Pulse: 82 84 87   Resp: 12  18   Temp: 97.7 F (36.5 C) 98.9 F (37.2 C) 97.6 F (36.4 C)   TempSrc:  Oral    SpO2: 97% 95% 100%   Weight:    57.3 kg  Height:        Intake/Output Summary (Last 24 hours) at 03/15/2023 0722 Last data filed at 03/15/2023 0302 Gross per 24 hour  Intake 413.75 ml  Output 1275 ml  Net -861.25 ml      03/15/2023    5:00 AM 03/13/2023    1:17 AM 03/12/2023    7:26 PM  Last 3 Weights  Weight (lbs) 126 lb 5.2 oz 124 lb 1.9 oz 130 lb  Weight (kg) 57.3 kg 56.3 kg 58.968 kg      Telemetry    NSR HR 80s - Personally Reviewed  ECG    No new - Personally Reviewed  Physical Exam   GEN: No acute distress.   Neck: No JVD Cardiac: RRR, no murmurs, rubs, or gallops.  Respiratory: minimally diminished GI: Soft, nontender, non-distended  MS: No edema; No deformity. Neuro:  Nonfocal   Psych: Normal affect   Labs    High Sensitivity Troponin:   Recent Labs  Lab 03/12/23 1542 03/12/23 2137  TROPONINIHS 20* 10     Chemistry Recent Labs  Lab 03/12/23 1954 03/13/23 0633 03/14/23 0309 03/15/23 0236  NA  --  136 139 138  K  --  3.5 3.5 3.6  CL  --  101 105 108  CO2  --  24 25 25   GLUCOSE  --  118* 90 103*  BUN  --  35* 24* 14  CREATININE  --  1.37* 0.94 0.63  CALCIUM  --  7.6* 7.6* 7.7*  MG 1.4* 2.3 2.2  --   ALBUMIN  --  2.7* 2.3* 2.4*  GFRNONAA  --  40* >60 >60  ANIONGAP  --  11 9 5     Lipids No results for input(s): "CHOL", "TRIG", "HDL", "LABVLDL", "LDLCALC", "CHOLHDL" in the last 168 hours.  Hematology Recent Labs  Lab 03/13/23 0633 03/14/23 0309 03/15/23 0236  WBC 32.7* 14.8* 7.5  RBC 3.65* 3.38* 3.50*  HGB 11.4* 10.5* 11.0*  HCT 32.8* 30.6* 32.4*  MCV 89.9 90.5 92.6  MCH 31.2 31.1 31.4  MCHC  34.8 34.3 34.0  RDW 13.5 13.2 13.1  PLT 147* 104* 111*   Thyroid  Recent Labs  Lab 03/13/23 2225  TSH 2.482  FREET4 1.08    BNPNo results for input(s): "BNP", "PROBNP" in the last 168 hours.  DDimer No results for input(s): "DDIMER" in the last 168 hours.   Radiology    ECHOCARDIOGRAM COMPLETE  Result Date: 03/14/2023    ECHOCARDIOGRAM REPORT   Patient Name:   Yvette Woodward Date of Exam: 03/14/2023 Medical Rec #:  [id number]        Height:       63.0 in Accession #:    [id number]       Weight:       124.1 lb Date of Birth:  12/22/1945        BSA:          1.579 m Patient Age:    77 years         BP:           133/85 mmHg Patient Gender: F                HR:           91 bpm. Exam Location:  [facility] Procedure: 2D Echo, Cardiac Doppler, Color Doppler and Strain Analysis Indications:     Atrial Flutter I48.92  History:         Patient has no prior history of Echocardiogram examinations.                  Renal Disorder.  Sonographer:     Neysa Bonito Roar Referring Phys:  [id number] BRITTON L R[place]STER Diagnosing Phys: Debbe Odea MD  Sonographer  Comments: Global longitudinal strain was attempted. IMPRESSIONS  1. Left ventricular ejection fraction, by estimation, is 55 to 60%. The left ventricle has normal function. The left ventricle has no regional wall motion abnormalities. There is mild left ventricular hypertrophy. Left ventricular diastolic parameters were normal. The average left ventricular global longitudinal strain is -16.5 %. The global longitudinal strain is normal.  2. Right ventricular systolic function is normal. The right ventricular size is normal.  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 There is mild late systolic prolapse of both leaflets of the mitral valve.  4. The aortic valve is tricuspid. Aortic valve regurgitation is not visualized.  5. The inferior vena cava is normal in size with greater than 50% respiratory variability, suggesting right atrial pressure of 3 mmHg. FINDINGS  Left Ventricle: Left ventricular ejection fraction, by estimation, is 55 to 60%. The left ventricle has normal function. The left ventricle has no regional wall motion abnormalities. The average left ventricular global longitudinal strain is -16.5 %. The global longitudinal strain is normal. The left ventricular internal cavity size was normal in size. There is mild left ventricular hypertrophy. Left ventricular diastolic parameters were normal. Right Ventricle: The right ventricular size is normal. No increase in right ventricular wall thickness. Right ventricular systolic function is normal. Left Atrium: Left atrial size was normal in size. Right Atrium: Right atrial size was normal in size. Pericardium: There is no evidence of pericardial effusion. 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 Mild mitral valve regurgitation. MV peak gradient, 3.5 mmHg. The mean mitral valve gradient is 2.0 mmHg. Tricuspid Valve: The tricuspid valve is normal in structure.  Tricuspid valve regurgitation is mild. Aortic Valve: The aortic valve is  tricuspid. Aortic valve regurgitation is not visualized. Aortic valve mean gradient measures 3.0 mmHg. Aortic valve peak gradient measures 5.7 mmHg. Aortic valve area, by VTI measures 1.61 cm. Pulmonic Valve: The pulmonic valve was not well visualized. Pulmonic valve regurgitation is not visualized. Aorta: The aortic root is normal in size and structure. Venous: The inferior vena cava is normal in size with greater than 50% respiratory variability, suggesting right atrial pressure of 3 mmHg. IAS/Shunts: No atrial level shunt detected by color flow Doppler.  LEFT VENTRICLE PLAX 2D LVIDd:         4.00 cm   Diastology LVIDs:         2.80 cm   LV e' medial:    8.70 cm/s LV PW:         0.80 cm   LV E/e' medial:  8.4 LV IVS:        1.10 cm   LV e' lateral:   6.85 cm/s LVOT diam:     1.70 cm   LV E/e' lateral: 10.7 LV SV:         34 LV SV Index:   22        2D Longitudinal Strain LVOT Area:     2.27 cm  2D Strain GLS Avg:     -16.5 %  RIGHT VENTRICLE RV Basal diam:  3.40 cm RV Mid diam:    2.90 cm RV S prime:     14.50 cm/s TAPSE (M-mode): 2.0 cm LEFT ATRIUM             Index        RIGHT ATRIUM           Index LA diam:        3.40 cm 2.15 cm/m   RA Area:     13.10 cm LA Vol (A2C):   50.9 ml 32.24 ml/m  RA Volume:   27.50 ml  17.42 ml/m LA Vol (A4C):   35.5 ml 22.49 ml/m LA Biplane Vol: 43.3 ml 27.43 ml/m  AORTIC VALVE                    PULMONIC VALVE AV Area (Vmax):    1.57 cm     PV Vmax:          0.90 m/s AV Area (Vmean):   1.57 cm     PV Peak grad:     3.2 mmHg AV Area (VTI):     1.61 cm     PR End Diast Vel: 3.34 msec AV Vmax:           119.00 cm/s  RVOT Peak grad:   2 mmHg AV Vmean:          78.500 cm/s AV VTI:            0.213 m AV Peak Grad:      5.7 mmHg AV Mean Grad:      3.0 mmHg LVOT Vmax:         82.20 cm/s LVOT Vmean:        54.300 cm/s LVOT VTI:          0.151 m LVOT/AV VTI ratio: 0.71  AORTA Ao Root diam: 2.30 cm Ao Asc  diam:  3.00 cm MITRAL VALVE               TRICUSPID VALVE MV Area (PHT): 7.22 cm    TR Peak grad:   24.0 mmHg MV Area VTI:   2.10 cm  TR Vmax:        245.00 cm/s MV Peak grad:  3.5 mmHg MV Mean grad:  2.0 mmHg    SHUNTS MV Vmax:       0.94 m/s    Systemic VTI:  0.15 m MV Vmean:      57.9 cm/s   Systemic Diam: 1.70 cm MV Decel Time: 105 msec MV E velocity: 73.40 cm/s MV A velocity: 85.40 cm/s MV E/A ratio:  0.86 MV A Prime:    11.6 cm/s Debbe Odea MD Electronically signed by Debbe Odea MD Signature Date/Time: 03/14/2023/2:19:59 PM    Final     Cardiac Studies   Echo 03/14/23  1. Left ventricular ejection fraction, by estimation, is 55 to 60%. The  left ventricle has normal function. The left ventricle has no regional  wall motion abnormalities. There is mild left ventricular hypertrophy.  Left ventricular diastolic parameters  were normal. The average left ventricular global longitudinal strain is  -16.5 %. The global longitudinal strain is normal.   2. Right ventricular systolic function is normal. The right ventricular  size is normal.   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   4. The aortic valve is tricuspid. Aortic valve regurgitation is not  visualized.   5. The inferior vena cava is normal in size with greater than 50%  respiratory variability, suggesting right atrial pressure of 3 mmHg.   Patient Profile     77 y.o. 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  - start low dose BB for rate control   Severe sepsis 2/2 cystitis - IVF and IV abx per [facility]   Renal stone - nonobstructive stone - urology following, plan for conservative management   AKI - resolved  For questions or updates, please contact [facility] Please consult [email or web address] for contact info under        Signed, Sheliah Fiorillo David Stall, PA-C  03/15/2023, 7:22 AM

## 2023-03-15 NOTE — Consult Note (Signed)
PHARMACY CONSULT NOTE  Pharmacy Consult for Electrolyte Monitoring and Replacement   Recent Labs: Potassium (mmol/L)  Date Value  03/15/2023 3.6   Magnesium (mg/dL)  Date Value  29/52/8413 2.2   Calcium (mg/dL)  Date Value  24/40/1027 7.7 (L)   Albumin (g/dL)  Date Value  25/36/6440 2.4 (L)   Phosphorus (mg/dL)  Date Value  34/74/2595 2.2 (L)   Sodium (mmol/L)  Date Value  03/15/2023 138   Assessment: 77 y/o F with medical history including kidney stones, diverticulosis, glaucoma admitted with emphysematous cystitis complicated by septic shock. Pharmacy consulted to assist with electrolyte monitoring and replacement as indicated.  Goal of Therapy:  Electrolytes within normal limits  Plan:  --K Phos Neutral 500 mg PO x 1 dose --Follow-up electrolytes with AM labs tomorrow   Tressie Ellis 03/15/2023 9:02 AM

## 2023-03-15 NOTE — Assessment & Plan Note (Addendum)
Septic shock, present on admission secondary urinary tract infection with E. coli.  Required vasopressors and ICU support initially.  Lactic acid initially 6.0, white blood cell count 44.8, patient with tachycardia and hypotension.  The E. coli growing out of the urine will switch meropenem over to Rocephin.  Add Flagyl to cover mouth organisms.  Discontinue Foley.

## 2023-03-15 NOTE — Hospital Course (Signed)
77 year old female came in with bilateral lower abdominal/flank pain.  Patient was admitted with septic shock, acute kidney injury and had a nonobstructing kidney stone.  Patient had a CT scan of the abdomen pelvis that showed multifocal gaseous foci within the left renal system and urinary bladder and an 8 mm nonobstructing stone in the left renal pelvis.    9/25.  Patient admitted to ICU with septic shock requiring vasopressors pressor support 9/27.  Off vasopressors.  Patient had an episode of atrial fibrillation and started on heparin drip.  Echocardiogram showed an EF of 55%. 9/28.  E. coli growing out of the urine.  Antibiotics switched from meropenem over to Rocephin.  Flagyl also added to cover the mouth. 9/29.  Patient stable for discharge home.  Rocephin switched over to Keflex to cover the urine.  Flagyl will cover the mouth.

## 2023-03-15 NOTE — Progress Notes (Signed)
ANTICOAGULATION CONSULT NOTE  Pharmacy Consult for Heparin  Indication: atrial fibrillation  Allergies  Allergen Reactions   Erythromycin Rash    Patient Measurements: Height: 5\' 3"  (160 cm) Weight: 56.3 kg (124 lb 1.9 oz) IBW/kg (Calculated) : 52.4 Heparin Dosing Weight:  56.3 kg   Vital Signs: Temp: 98.9 F (37.2 C) (09/27 2341) Temp Source: Oral (09/27 2341) BP: 140/76 (09/27 2341) Pulse Rate: 84 (09/27 2341)  Labs: Recent Labs    03/12/23 1542 03/12/23 2137 03/13/23 1914 03/14/23 0309 03/14/23 0719 03/14/23 1632 03/15/23 0236  HGB  --   --  11.4* 10.5*  --   --  11.0*  HCT  --   --  32.8* 30.6*  --   --  32.4*  PLT  --   --  147* 104*  --   --  111*  HEPARINUNFRC  --   --   --   --  0.29* 0.27* 0.35  CREATININE  --   --  1.37* 0.94  --   --  0.63  TROPONINIHS 20* 10  --   --   --   --   --     Estimated Creatinine Clearance: 48.7 mL/min (by C-G formula based on SCr of 0.63 mg/dL).   Medical History: Past Medical History:  Diagnosis Date   Kidney stone    Renal disorder    kidney stones    Medications:  Medications Prior to Admission  Medication Sig Dispense Refill Last Dose   latanoprost (XALATAN) 0.005 % ophthalmic solution  (Patient not taking: Reported on 03/12/2023)   Not Taking   ondansetron (ZOFRAN ODT) 4 MG disintegrating tablet Take 1 tablet (4 mg total) by mouth every 8 (eight) hours as needed for nausea or vomiting. (Patient not taking: Reported on 03/12/2023) 15 tablet 0 Not Taking   ondansetron (ZOFRAN) 4 MG tablet Take 1 tablet (4 mg total) by mouth daily as needed. 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    0927 0719 HL 0.29 0927 1632 HL 0.27 0928 0236 HL 0.35  Goal of Therapy:  Heparin level 0.3-0.7 units/ml Monitor platelets by  anticoagulation protocol: Yes   Plan:  9/28:  HL @ 0236 = 0.35, therapeutic X 1 - Will continue pt on current rate and recheck HL on 9/28 @ 1100.    Denver Harder D Clinical Pharmacist  03/15/2023 3:05 AM

## 2023-03-15 NOTE — Assessment & Plan Note (Signed)
Supplement B12.

## 2023-03-15 NOTE — Plan of Care (Signed)

## 2023-03-15 NOTE — Assessment & Plan Note (Signed)
Continue supplements

## 2023-03-15 NOTE — Assessment & Plan Note (Signed)
Treated during hospital course.

## 2023-03-16 DIAGNOSIS — N179 Acute kidney failure, unspecified: Secondary | ICD-10-CM

## 2023-03-16 DIAGNOSIS — E440 Moderate protein-calorie malnutrition: Secondary | ICD-10-CM | POA: Diagnosis not present

## 2023-03-16 DIAGNOSIS — A419 Sepsis, unspecified organism: Secondary | ICD-10-CM | POA: Diagnosis not present

## 2023-03-16 DIAGNOSIS — K047 Periapical abscess without sinus: Secondary | ICD-10-CM | POA: Diagnosis not present

## 2023-03-16 DIAGNOSIS — I4891 Unspecified atrial fibrillation: Secondary | ICD-10-CM | POA: Diagnosis not present

## 2023-03-16 LAB — CBC
HCT: 35.1 % — ABNORMAL LOW (ref 36.0–46.0)
Hemoglobin: 12 g/dL (ref 12.0–15.0)
MCH: 31.3 pg (ref 26.0–34.0)
MCHC: 34.2 g/dL (ref 30.0–36.0)
MCV: 91.4 fL (ref 80.0–100.0)
Platelets: 134 10*3/uL — ABNORMAL LOW (ref 150–400)
RBC: 3.84 MIL/uL — ABNORMAL LOW (ref 3.87–5.11)
RDW: 12.8 % (ref 11.5–15.5)
WBC: 5.8 10*3/uL (ref 4.0–10.5)
nRBC: 0 % (ref 0.0–0.2)

## 2023-03-16 LAB — RENAL FUNCTION PANEL
Albumin: 2.8 g/dL — ABNORMAL LOW (ref 3.5–5.0)
Anion gap: 8 (ref 5–15)
BUN: 9 mg/dL (ref 8–23)
CO2: 24 mmol/L (ref 22–32)
Calcium: 8 mg/dL — ABNORMAL LOW (ref 8.9–10.3)
Chloride: 107 mmol/L (ref 98–111)
Creatinine, Ser: 0.58 mg/dL (ref 0.44–1.00)
GFR, Estimated: >60 mL/min (ref >60)
Glucose, Bld: 97 mg/dL (ref 70–99)
Phosphorus: 2.4 mg/dL — ABNORMAL LOW (ref 2.5–4.6)
Potassium: 3.3 mmol/L — ABNORMAL LOW (ref 3.5–5.1)
Sodium: 139 mmol/L (ref 135–145)

## 2023-03-16 MED ORDER — CYANOCOBALAMIN 1000 MCG PO TABS: 1000.0000 ug | ORAL_TABLET | Freq: Every day | ORAL | 0 refills | Status: AC

## 2023-03-16 MED ORDER — POTASSIUM CHLORIDE CRYS ER 20 MEQ PO TBCR
20.0000 meq | EXTENDED_RELEASE_TABLET | Freq: Once | ORAL | Status: AC
  Administered 2023-03-16: 20 meq via ORAL
  Filled 2023-03-16: qty 1

## 2023-03-16 MED ORDER — CEPHALEXIN 500 MG PO CAPS: 500.0000 mg | ORAL_CAPSULE | Freq: Three times a day (TID) | ORAL | 0 refills | Status: AC

## 2023-03-16 MED ORDER — ONDANSETRON HCL 4 MG PO TABS: 4.0000 mg | ORAL_TABLET | Freq: Three times a day (TID) | ORAL | 0 refills | Status: DC | PRN

## 2023-03-16 MED ORDER — METRONIDAZOLE 500 MG PO TABS: 500.0000 mg | ORAL_TABLET | Freq: Two times a day (BID) | ORAL | 0 refills | Status: AC

## 2023-03-16 MED ORDER — APIXABAN 5 MG PO TABS: 5.0000 mg | ORAL_TABLET | Freq: Two times a day (BID) | ORAL | 0 refills | Status: DC

## 2023-03-16 MED ORDER — METOPROLOL SUCCINATE ER 50 MG PO TB24: 50.0000 mg | ORAL_TABLET | Freq: Every day | ORAL | 0 refills | Status: DC

## 2023-03-16 MED ORDER — POLYETHYLENE GLYCOL 3350 17 G PO PACK: 17.0000 g | PACK | Freq: Every day | ORAL | 0 refills | Status: DC | PRN

## 2023-03-16 MED ORDER — ENSURE ENLIVE PO LIQD: 237.0000 mL | Freq: Two times a day (BID) | ORAL | 0 refills | Status: DC

## 2023-03-16 MED ORDER — K PHOS MONO-SOD PHOS DI & MONO 155-852-130 MG PO TABS
500.0000 mg | ORAL_TABLET | Freq: Two times a day (BID) | ORAL | Status: DC
  Administered 2023-03-16: 500 mg via ORAL
  Filled 2023-03-16: qty 2

## 2023-03-16 NOTE — Progress Notes (Signed)
Mobility Specialist - Progress Note   03/16/23 0910  Mobility  Activity Ambulated with assistance in hallway;Stood at bedside;Dangled on edge of bed  Level of Assistance Standby assist, set-up cues, supervision of patient - no hands on  Assistive Device None  Distance Ambulated (ft) 200 ft  Activity Response Tolerated well  Mobility Referral Yes  $Mobility charge 1 Mobility  Mobility Specialist Start Time (ACUTE ONLY) 0834  Mobility Specialist Stop Time (ACUTE ONLY) 0849  Mobility Specialist Time Calculation (min) (ACUTE ONLY) 15 min   Pt supine in bed on RA upon arrival. Pt completes bed mobility, STS, and ambulates in hallway Supervision with no physical assistance. Pt returns to bed with needs in reach.   Terrilyn Saver  Mobility Specialist  03/16/23 9:11 AM

## 2023-03-16 NOTE — Plan of Care (Signed)

## 2023-03-16 NOTE — TOC Transition Note (Signed)
Transition of Care [facility]) - CM/SW Discharge Note   Patient Details  Name: Yvette Woodward MRN: [id number] Date of Birth: April 17, 1946  [facility]) CM/SW Contact:  Bing Quarry, RN Phone Number: 03/16/2023, 11:12 AM   Clinical Narrative:  9/29: Patient is discharging to home with home health via [place] per Kandee Keen just now. Sister called RN CM and patient verbally agreed to [facility] on discharge. Cory given sister's home address and contact information.  Sister present in room and will transport to sister's home on discharge. Follow up instructions, appointments in AVS summary, discussed earlier with sister. Needs to see PCP within a week if possible.   [name] (Sister) [phone] [facility] Phone) [address]  [place], [place] [address]  Final next level of care: Home w Home Health Services      Patient Goals and CMS Choice CMS Medicare.gov Compare Post Acute Care list provided to:: Other (Comment Required) (Sister) Choice offered to / list presented to : Patient, Sibling  Discharge Placement                    Name of family member notified: [name] (Sister)  [phone] [facility] Phone) (NOtified of discharge today to home with sister.)    Discharge Plan and Services Additional resources added to the After Visit Summary for                  DME Arranged: N/A (Has RW at home per sister who she will be living with post discharge, declined [facility] or will obtain one later if needed.) DME Agency: NA       HH Arranged: PT, OT HH Agency:  (Pending response from [facility] agency at 1057.) Date HH Agency Contacted: 03/16/23 Time HH Agency Contacted:  (Pending response from [facility] 1057 am.)    Social Determinants of Health (SDOH) Interventions SDOH Screenings   Food Insecurity: No Food Insecurity (03/13/2023)  Housing: Low Risk  (03/13/2023)  Transportation Needs: No Transportation Needs (03/13/2023)  Utilities: Not At Risk (03/13/2023)  Tobacco Use: Low Risk  (03/13/2023)      Readmission Risk Interventions     No data to display

## 2023-03-16 NOTE — TOC Progression Note (Signed)
Transition of Care [facility]) - Progression Note    Patient Details  Name: Yvette Woodward MRN: [id number] Date of Birth: 18-May-1946  Transition of Care [facility]) CM/SW Contact  Bing Quarry, RN Phone Number: 03/16/2023, 10:40 AM  Clinical Narrative: 9/29: Provider spoke with sister on the phone and she is requesting the [facility] services. RN CM spoke with her and she is going to call from patient's room when she arrives at hospital as RN CM informed her that patient would have to agree to [facility] services. She has not had HH services before. Sister will call RN CM and put patient on speaker phone to discuss. Pt has a RW at home but sister did not feel she needed [facility] or they would pick one up on their own.    Patient was driving PTA, has not transportation issues as will be living with sister at [address], [place], [place] [address]. Patient will be staying on one level of the home with an attached  bathroom. Three steps into the house.   Patient has not seen PCP, Joen Laura,  in years and RN CM recommended to sister that an appointment be made ASAP for follow up and any additional HH orders that may arise.    Eliquis coupon printed to unit at [facility] printer as patient discharging on this medication. It has patient education information attached.   RN CM will attempt to secure [facility] agency pending patient's approval or continued decline of HH services on discharge. Sister will transport to her home on discharge.   Reached out to [facility] and; pending feedback. 1050 am.   Gabriel Cirri MSN RN CM  Transitions of Care Department [facility] [phone] [facility]) [phone] [facility] Phone)  [address] [place], [place] [address].        Expected Discharge Plan and Services         Expected Discharge Date: 03/16/23                                     Social Determinants of Health (SDOH) Interventions SDOH Screenings   Food Insecurity: No Food Insecurity (03/13/2023)   Housing: Low Risk  (03/13/2023)  Transportation Needs: No Transportation Needs (03/13/2023)  Utilities: Not At Risk (03/13/2023)  Tobacco Use: Low Risk  (03/13/2023)    Readmission Risk Interventions     No data to display

## 2023-03-16 NOTE — Discharge Summary (Signed)
Physician Discharge Summary   Patient: Yvette Woodward MRN: [id number] DOB: 1945/08/21  Admit date:     03/12/2023  Discharge date: 03/16/23  Discharge Physician: Alford Highland   PCP: Dortha Kern, MD   Recommendations at discharge:   Follow-up PCP 5 days Follow-up [facility] dental clinic 2 to 3 weeks Follow-up urology 2 weeks  Discharge Diagnoses: Principal Problem:   Septic shock (HCC) Active Problems:   New onset atrial fibrillation (HCC)   Dental infection   Malnutrition of moderate degree   Thrombocytopenia (HCC)   Hypomagnesemia   Vitamin B12 deficiency   E. coli urinary tract infection   Emphysematous cystitis   AKI (acute kidney injury) [facility]   Hospital Course: 77 year old female came in with bilateral lower abdominal/flank pain.  Patient was admitted with septic shock, acute kidney injury and had a nonobstructing kidney stone.  Patient had a CT scan of the abdomen pelvis that showed multifocal gaseous foci within the left renal system and urinary bladder and an 8 mm nonobstructing stone in the left renal pelvis.    9/25.  Patient admitted to ICU with septic shock requiring vasopressors pressor support 9/27.  Off vasopressors.  Patient had an episode of atrial fibrillation and started on heparin drip.  Echocardiogram showed an EF of 55%. 9/28.  E. coli growing out of the urine.  Antibiotics switched from meropenem over to Rocephin.  Flagyl also added to cover the mouth. 9/29.  Patient stable for discharge home.  Rocephin switched over to Keflex to cover the urine.  Flagyl will cover the mouth.  Assessment and Plan: * Septic shock (HCC) Septic shock, present on admission secondary urinary tract infection with E. coli.  Required vasopressors and ICU support initially.  Lactic acid initially 6.0, white blood cell count 44.8, patient with tachycardia and hypotension.  9/28.  E. coli growing out of the urine will switch meropenem over to Rocephin and Add Flagyl to cover mouth  organisms.  Discontinue Foley on 9/28.  Will discharge home on Keflex to cover the urine and Flagyl to cover her mouth.  New onset atrial fibrillation (HCC) Continue Eliquis and Toprol.  Risk of bleeding explained to patient and sister.  Dental infection Spoke with patient and patient's sister that she will need to have all of her teeth removed as outpatient.  Will need to hold Eliquis for at least 2 days prior to procedure.  Patient was on meropenem initially and will switch over to Flagyl upon discharge.  Malnutrition of moderate degree Continue supplements  AKI (acute kidney injury) (HCC) Resolved.  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  Patient will need to be on 2 weeks of antibiotics and follow-up with urology as outpatient for nonobstructing left pelvic renal stone.  Vitamin B12 deficiency Supplement B12  Hypomagnesemia Treated during hospital course.  Thrombocytopenia (HCC) Likely secondary to sepsis.  This platelet count 134.         Consultants: Critical care, urology, cardiology Procedures performed: None Disposition: Home Diet recommendation:  Carb modified diet DISCHARGE MEDICATION: Allergies as of 03/16/2023       Reactions   Erythromycin Rash        Medication List     STOP taking these medications    latanoprost 0.005 % ophthalmic solution Commonly known as: XALATAN   ondansetron 4 MG disintegrating tablet Commonly known as: Zofran ODT       TAKE these medications    apixaban 5 MG Tabs tablet  Commonly known as: ELIQUIS Take 1 tablet (5 mg total) by mouth 2 (two) times daily.   cephALEXin 500 MG capsule Commonly known as: KEFLEX Take 1 capsule (500 mg total) by mouth 3 (three) times daily for 10 days. Start taking on: March 17, 2023   cyanocobalamin 1000 MCG tablet Take 1 tablet (1,000 mcg total) by mouth daily. Start taking on: March 17, 2023   feeding supplement  Liqd Take 237 mLs by mouth 2 (two) times daily between meals.   metoprolol succinate 50 MG 24 hr tablet Commonly known as: TOPROL-XL Take 1 tablet (50 mg total) by mouth daily. Take with or immediately following a meal. Start taking on: March 17, 2023   metroNIDAZOLE 500 MG tablet Commonly known as: FLAGYL Take 1 tablet (500 mg total) by mouth every 12 (twelve) hours for 10 days.   ondansetron 4 MG tablet Commonly known as: Zofran Take 1 tablet (4 mg total) by mouth every 8 (eight) hours as needed for nausea or vomiting. What changed:  when to take this reasons to take this   polyethylene glycol 17 g packet Commonly known as: MIRALAX / GLYCOLAX Take 17 g by mouth daily as needed for moderate constipation.               Durable Medical Equipment  (From admission, onward)           Start     Ordered   03/15/23 1243  For home use only DME Walker rolling  Once       Question Answer Comment  Walker: With 5 Inch Wheels   Patient needs a walker to treat with the following condition Unsteady gait when walking      03/15/23 1242            Follow-up Information     Dortha Kern, MD Follow up in 5 day(s).   Specialty: Family Medicine Contact information: [address] [place] [address] [phone]         [facility] dental clinic Follow up in 2 week(s).   Why: [phone]        Vanna Scotland, MD Follow up.   Specialty: Urology Why: hospital follow up Contact information: [address] [place] [address] [phone]                Discharge Exam: Ceasar Mons Weights   03/12/23 1926 03/13/23 0117 03/15/23 0500  Weight: 59 kg 56.3 kg 57.3 kg   Physical Exam HENT:     Head: Normocephalic.     Mouth/Throat:     Comments: Poor dentition Eyes:     General: Lids are normal.     Conjunctiva/sclera: Conjunctivae normal.  Cardiovascular:     Rate and Rhythm: Normal rate and regular rhythm.     Heart sounds:  Normal heart sounds, S1 normal and S2 normal.  Pulmonary:     Breath sounds: No decreased breath sounds, wheezing, rhonchi or rales.  Abdominal:     Palpations: Abdomen is soft.     Tenderness: There is no abdominal tenderness.  Musculoskeletal:     Right lower leg: No swelling.     Left lower leg: No swelling.  Skin:    General: Skin is warm.     Findings: No rash.  Neurological:     Mental Status: She is alert and oriented to person, place, and time.      Condition at discharge: stable  The results of significant diagnostics from this hospitalization (  including imaging, microbiology, ancillary and laboratory) are listed below for reference.   Imaging Studies: ECHOCARDIOGRAM COMPLETE  Result Date: 03/14/2023    ECHOCARDIOGRAM REPORT   Patient Name:   Yvette Woodward Date of Exam: 03/14/2023 Medical Rec #:  [id number]        Height:       63.0 in Accession #:    [id number]       Weight:       124.1 lb Date of Birth:  May 09, 1946        BSA:          1.579 m Patient Age:    77 years         BP:           133/85 mmHg Patient Gender: F                HR:           91 bpm. Exam Location:  [facility] Procedure: 2D Echo, Cardiac Doppler, Color Doppler and Strain Analysis Indications:     Atrial Flutter I48.92  History:         Patient has no prior history of Echocardiogram examinations.                  Renal Disorder.  Sonographer:     Neysa Bonito Roar Referring Phys:  [id number] BRITTON L RUST-CHESTER Diagnosing Phys: Debbe Odea MD  Sonographer Comments: Global longitudinal strain was attempted. IMPRESSIONS  1. Left ventricular ejection fraction, by estimation, is 55 to 60%. The left ventricle has normal function. The left ventricle has no regional wall motion abnormalities. There is mild left ventricular hypertrophy. Left ventricular diastolic parameters were normal. The average left ventricular global longitudinal strain is -16.5 %. The global longitudinal strain is normal.  2. Right ventricular  systolic function is normal. The right ventricular size is normal.  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 There is mild late systolic prolapse of both leaflets of the mitral valve.  4. The aortic valve is tricuspid. Aortic valve regurgitation is not visualized.  5. The inferior vena cava is normal in size with greater than 50% respiratory variability, suggesting right atrial pressure of 3 mmHg. FINDINGS  Left Ventricle: Left ventricular ejection fraction, by estimation, is 55 to 60%. The left ventricle has normal function. The left ventricle has no regional wall motion abnormalities. The average left ventricular global longitudinal strain is -16.5 %. The global longitudinal strain is normal. The left ventricular internal cavity size was normal in size. There is mild left ventricular hypertrophy. Left ventricular diastolic parameters were normal. Right Ventricle: The right ventricular size is normal. No increase in right ventricular wall thickness. Right ventricular systolic function is normal. Left Atrium: Left atrial size was normal in size. Right Atrium: Right atrial size was normal in size. Pericardium: There is no evidence of pericardial effusion. 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 Mild mitral valve regurgitation. MV peak gradient, 3.5 mmHg. The mean mitral valve gradient is 2.0 mmHg. Tricuspid Valve: The tricuspid valve is normal in structure. Tricuspid valve regurgitation is mild. Aortic Valve: The aortic valve is tricuspid. Aortic valve regurgitation is not visualized. Aortic valve mean gradient measures 3.0 mmHg. Aortic valve peak gradient measures 5.7 mmHg. Aortic valve area, by VTI measures 1.61 cm. Pulmonic Valve: The pulmonic valve was not well visualized. Pulmonic valve regurgitation is not visualized. Aorta: The aortic  root is normal in size and structure. Venous: The inferior vena cava  is normal in size with greater than 50% respiratory variability, suggesting right atrial pressure of 3 mmHg. IAS/Shunts: No atrial level shunt detected by color flow Doppler.  LEFT VENTRICLE PLAX 2D LVIDd:         4.00 cm   Diastology LVIDs:         2.80 cm   LV e' medial:    8.70 cm/s LV PW:         0.80 cm   LV E/e' medial:  8.4 LV IVS:        1.10 cm   LV e' lateral:   6.85 cm/s LVOT diam:     1.70 cm   LV E/e' lateral: 10.7 LV SV:         34 LV SV Index:   22        2D Longitudinal Strain LVOT Area:     2.27 cm  2D Strain GLS Avg:     -16.5 %  RIGHT VENTRICLE RV Basal diam:  3.40 cm RV Mid diam:    2.90 cm RV S prime:     14.50 cm/s TAPSE (M-mode): 2.0 cm LEFT ATRIUM             Index        RIGHT ATRIUM           Index LA diam:        3.40 cm 2.15 cm/m   RA Area:     13.10 cm LA Vol (A2C):   50.9 ml 32.24 ml/m  RA Volume:   27.50 ml  17.42 ml/m LA Vol (A4C):   35.5 ml 22.49 ml/m LA Biplane Vol: 43.3 ml 27.43 ml/m  AORTIC VALVE                    PULMONIC VALVE AV Area (Vmax):    1.57 cm     PV Vmax:          0.90 m/s AV Area (Vmean):   1.57 cm     PV Peak grad:     3.2 mmHg AV Area (VTI):     1.61 cm     PR End Diast Vel: 3.34 msec AV Vmax:           119.00 cm/s  RVOT Peak grad:   2 mmHg AV Vmean:          78.500 cm/s AV VTI:            0.213 m AV Peak Grad:      5.7 mmHg AV Mean Grad:      3.0 mmHg LVOT Vmax:         82.20 cm/s LVOT Vmean:        54.300 cm/s LVOT VTI:          0.151 m LVOT/AV VTI ratio: 0.71  AORTA Ao Root diam: 2.30 cm Ao Asc diam:  3.00 cm MITRAL VALVE               TRICUSPID VALVE MV Area (PHT): 7.22 cm    TR Peak grad:   24.0 mmHg MV Area VTI:   2.10 cm    TR Vmax:        245.00 cm/s MV Peak grad:  3.5 mmHg MV Mean grad:  2.0 mmHg    SHUNTS MV Vmax:       0.94 m/s    Systemic VTI:  0.15  m MV Vmean:      57.9 cm/s   Systemic Diam: 1.70 cm MV Decel Time: 105 msec MV E velocity: 73.40 cm/s MV A velocity: 85.40 cm/s MV E/A ratio:  0.86 MV A Prime:    11.6 cm/s Debbe Odea MD  Electronically signed by Debbe Odea MD Signature Date/Time: 03/14/2023/2:19:59 PM    Final    CT MAXILLOFACIAL WO CONTRAST  Result Date: 03/12/2023 CLINICAL DATA:  Sublingual/submandibular abscess suspected. Injury to the lower jaw. EXAM: CT MAXILLOFACIAL WITHOUT CONTRAST TECHNIQUE: Multidetector CT imaging of the maxillofacial structures was performed. Multiplanar CT image reconstructions were also generated. RADIATION DOSE REDUCTION: This exam was performed according to the departmental dose-optimization program which includes automated exposure control, adjustment of the mA and/or kV according to patient size and/or use of iterative reconstruction technique. COMPARISON:  None Available. FINDINGS: Osseous: Small osseous fragment adjacent to an area of cortical irregularity about the posterior right mandible (series 4/image 41 and 7/27). No mandibular dislocation. Multiple missing teeth. Periapical lucencies about multiple remaining teeth compatible with periodontal disease. Orbits: Negative. No traumatic or inflammatory finding. Sinuses: Mucosal thickening in the right maxillary sinus with hyperostosis of the right maxillary sinus walls. Paranasal sinuses are otherwise well aerated. No mastoid effusion. Soft tissues: No soft tissue abscess. Limited intracranial: No acute abnormality. IMPRESSION: 1. Small osseous fragment adjacent to an area of cortical irregularity about the posterior right mandible. This is favored to represent a fracture fragment with possible adjacent osteomyelitis. Direct visualization is recommended. No soft tissue abscess. 2. Periodontal disease. 3. Chronic right maxillary sinusitis Electronically Signed   By: Minerva Fester M.D.   On: 03/12/2023 22:08   CT ABDOMEN PELVIS WO CONTRAST  Result Date: 03/12/2023 CLINICAL DATA:  Several day history of bilateral flank pain associated with mild dysuria EXAM: CT ABDOMEN AND PELVIS WITHOUT CONTRAST TECHNIQUE: Multidetector CT imaging  of the abdomen and pelvis was performed following the standard protocol without IV contrast. RADIATION DOSE REDUCTION: This exam was performed according to the departmental dose-optimization program which includes automated exposure control, adjustment of the mA and/or kV according to patient size and/or use of iterative reconstruction technique. COMPARISON:  CT abdomen and pelvis dated 02/22/2017 FINDINGS: Lower chest: No focal consolidation or pulmonary nodule in the lung bases. No pleural effusion or pneumothorax demonstrated. Partially imaged heart size is normal. Coronary artery calcifications. Hepatobiliary: No focal hepatic lesions. No intra or extrahepatic biliary ductal dilation. Normal gallbladder. Pancreas: Similar prominence of the pancreatic duct measuring 3 mm. Spleen: Normal in size without focal abnormality. Adrenals/Urinary Tract: No adrenal nodules. No suspicious renal mass on this noncontrast enhanced examination or hydronephrosis. Multifocal gaseous foci within the left renal collecting system. 8 mm nonobstructing stone within the left renal pelvis, which demonstrates mural thickening. Intraluminal gas within the urinary bladder. Stomach/Bowel: Metallic radiodensities in the gastric fundus, likely postsurgical. No evidence of bowel wall thickening, distention, or inflammatory changes. Appendix is not discretely seen. Diverticulosis without acute diverticulitis. Normal appendix. Vascular/Lymphatic: Aortic atherosclerosis. No enlarged abdominal or pelvic lymph nodes. Reproductive: No adnexal masses. Other: No free fluid, fluid collection, or free air. Musculoskeletal: No acute or abnormal lytic or blastic osseous lesions. Multilevel degenerative changes of the partially imaged thoracic and lumbar spine. IMPRESSION: 1. Multifocal gaseous foci within the left renal collecting system and urinary bladder, which may be related to gas-forming infection given absent history of recent instrumentation.  Recommend correlation with urinalysis and urine cultures. 2. An 8 mm nonobstructing stone within the left renal pelvis.  3. Diverticulosis without acute diverticulitis. 4. Aortic Atherosclerosis (ICD10-I70.0). Coronary artery calcifications. Assessment for potential risk factor modification, dietary therapy or pharmacologic therapy may be warranted, if clinically indicated. These results were called by telephone at the time of interpretation on 03/12/2023 at 4:58 pm to provider [facility] , who verbally acknowledged these results. Electronically Signed   By: Agustin Cree M.D.   On: 03/12/2023 17:00   DG Chest Portable 1 View  Result Date: 03/12/2023 CLINICAL DATA:  Several day history of bilateral flank pain EXAM: PORTABLE CHEST 1 VIEW COMPARISON:  Chest radiograph dated 02/22/2017 FINDINGS: Mildly low lung volumes. No focal consolidations. No pleural effusion or pneumothorax. The heart size and mediastinal contours are within normal limits. Severe degenerative changes of the bilateral shoulders. Irregular ossification is again seen projecting over the right scapula stable over multiple prior studies. IMPRESSION: No active disease. Electronically Signed   By: Agustin Cree M.D.   On: 03/12/2023 16:49    Microbiology: Results for orders placed or performed during the hospital encounter of 03/12/23  Culture, blood (routine x 2)     Status: None (Preliminary result)   Collection Time: 03/12/23  6:10 PM   Specimen: BLOOD  Result Value Ref Range Status   Specimen Description BLOOD LEFT ANTECUBITAL  Final   Special Requests   Final    BOTTLES DRAWN AEROBIC AND ANAEROBIC Blood Culture adequate volume   Culture   Final    NO GROWTH 4 DAYS Performed at [facility], [address], [place], [place] [address]    Report Status PENDING  Incomplete  Culture, blood (routine x 2)     Status: None (Preliminary result)   Collection Time: 03/12/23  6:10 PM   Specimen: BLOOD  Result Value Ref Range  Status   Specimen Description BLOOD RIGHT ANTECUBITAL  Final   Special Requests   Final    BOTTLES DRAWN AEROBIC AND ANAEROBIC Blood Culture adequate volume   Culture   Final    NO GROWTH 4 DAYS Performed at [facility], [address], [place], [place] [address]    Report Status PENDING  Incomplete  Urine Culture     Status: Abnormal   Collection Time: 03/12/23  6:10 PM   Specimen: Urine, Random  Result Value Ref Range Status   Specimen Description   Final    URINE, RANDOM Performed at [facility], [address], [place], [place] [address]    Special Requests   Final    NONE Reflexed from [id number] Performed at [facility], [address] [place], [place] [address]    Culture 30,000 COLONIES/mL ESCHERICHIA COLI (A)  Final   Report Status 03/15/2023 FINAL  Final   Organism ID, Bacteria ESCHERICHIA COLI (A)  Final      Susceptibility   Escherichia coli - MIC*    AMPICILLIN >=32 RESISTANT Resistant     CEFAZOLIN <=4 SENSITIVE Sensitive     CEFEPIME <=0.12 SENSITIVE Sensitive     CEFTRIAXONE <=0.25 SENSITIVE Sensitive     CIPROFLOXACIN <=0.25 SENSITIVE Sensitive     GENTAMICIN <=1 SENSITIVE Sensitive     IMIPENEM <=0.25 SENSITIVE Sensitive     NITROFURANTOIN <=16 SENSITIVE Sensitive     TRIMETH/SULFA >=320 RESISTANT Resistant     AMPICILLIN/SULBACTAM 16 INTERMEDIATE Intermediate     PIP/TAZO <=4 SENSITIVE Sensitive     * 30,000 COLONIES/mL ESCHERICHIA COLI  MRSA Next Gen by PCR, Nasal     Status: None   Collection Time:  03/12/23  9:37 PM   Specimen: Nasal Mucosa; Nasal Swab  Result Value Ref Range Status   MRSA by PCR Next Gen NOT DETECTED NOT DETECTED Final    Comment: (NOTE) The GeneXpert MRSA Assay (FDA approved for NASAL specimens only), is one component of a comprehensive MRSA colonization surveillance program. It is not intended to diagnose MRSA infection nor to guide or monitor treatment for MRSA infections. Test performance is not  FDA approved in patients less than 30 years old. Performed at [facility], [address] [place], [place] [address]   SARS Coronavirus 2 by RT PCR (hospital order, performed in [facility] hospital lab) *cepheid single result test* Nasal Mucosa     Status: None   Collection Time: 03/12/23  9:37 PM   Specimen: Nasal Mucosa; Nasal Swab  Result Value Ref Range Status   SARS Coronavirus 2 by RT PCR NEGATIVE NEGATIVE Final    Comment: (NOTE) SARS-CoV-2 target nucleic acids are NOT DETECTED.  The SARS-CoV-2 RNA is generally detectable in upper and lower respiratory specimens during the acute phase of infection. The lowest concentration of SARS-CoV-2 viral copies this assay can detect is 250 copies / mL. A negative result does not preclude SARS-CoV-2 infection and should not be used as the sole basis for treatment or other patient management decisions.  A negative result may occur with improper specimen collection / handling, submission of specimen other than nasopharyngeal swab, presence of viral mutation(s) within the areas targeted by this assay, and inadequate number of viral copies (<250 copies / mL). A negative result must be combined with clinical observations, patient history, and epidemiological information.  Fact Sheet for Patients:   RoadLapTop.co.za  Fact Sheet for Healthcare Providers: http://kim-miller.com/  This test is not yet approved or  cleared by the [place] FDA and has been authorized for detection and/or diagnosis of SARS-CoV-2 by FDA under an Emergency Use Authorization (EUA).  This EUA will remain in effect (meaning this test can be used) for the duration of the COVID-19 declaration under Section 564(b)(1) of the Act, 21 U.S.C. section 360bbb-3(b)(1), unless the authorization is terminated or revoked sooner.  Performed at [facility], [address] [place], [place] [address]      Labs: CBC: Recent Labs  Lab 03/12/23 1446 03/13/23 0981 03/14/23 0309 03/15/23 0236 03/16/23 0444  WBC 44.8* 32.7* 14.8* 7.5 5.8  NEUTROABS 40.0*  --   --   --   --   HGB 13.6 11.4* 10.5* 11.0* 12.0  HCT 40.6 32.8* 30.6* 32.4* 35.1*  MCV 94.0 89.9 90.5 92.6 91.4  PLT 198 147* 104* 111* 134*   Basic Metabolic Panel: Recent Labs  Lab 03/12/23 1446 03/12/23 1954 03/13/23 0633 03/14/23 0309 03/15/23 0236 03/16/23 0444  NA 137  --  136 139 138 139  K 3.6  --  3.5 3.5 3.6 3.3*  CL 104  --  101 105 108 107  CO2 15*  --  24 25 25 24   GLUCOSE 126*  --  118* 90 103* 97  BUN 30*  --  35* 24* 14 9  CREATININE 2.19*  --  1.37* 0.94 0.63 0.58  CALCIUM 8.7*  --  7.6* 7.6* 7.7* 8.0*  MG  --  1.4* 2.3 2.2  --   --   PHOS  --  3.4 3.3 2.4* 2.2* 2.4*   Liver Function Tests: Recent Labs  Lab 03/13/23 0633 03/14/23 0309 03/15/23 0236 03/16/23 0444  ALBUMIN 2.7* 2.3* 2.4* 2.8*  CBG: Recent Labs  Lab 03/13/23 0733 03/13/23 1125 03/13/23 1609 03/13/23 1939 03/14/23 0029  GLUCAP 115* 117* 99 96 92    Discharge time spent: greater than 30 minutes.  Signed: Alford Highland, MD [facility] 03/16/2023

## 2023-03-16 NOTE — TOC CM/SW Note (Signed)
Transition of Care [facility]) - Inpatient Brief Assessment   Patient Details  Name: TANIEKA POWNALL MRN: [id number] Date of Birth: Nov 13, 1945  Transition of Care [facility]) CM/SW Contact:    Bing Quarry, RN Phone Number: 03/16/2023, 9:31 AM   Clinical Narrative: 9/29: Sherron Monday with patient regarding PT recommendations for [facility] PT on discharge. Patient stated she had tinnitus and had some trouble understanding but said she was moving in with her sister and had help and also was able to get around okay by herself. She declined HH and DME at this time. .   Per mobility specialist note of 9/28: "Pt completes bed mobility indep with extra time. Pt STS and ambulates to/from bathroom and in hallway CGA-SBA".  Per notes patient will need a follow up urology appointment on discharge.   PCP: Joen Laura [phone] Ins: [employer] Medicare Rx: [id number] - [place] Rough (N), [place] - [address] [[id number]]    Gabriel Cirri MSN RN CM  Transitions of Care Department [facility] [phone] Weekends Only    [name] (Sister) [phone] (Home Phone)    Transition of Care Asessment: Insurance and Status: Insurance coverage has been reviewed Patient has primary care physician: Yes Home environment has been reviewed: Has help at home, came from home. Prior level of function:: Prior Level of Function : Independent/Modified Independent  Mobility Comments: previously lived alone, Independent. will be moving in with her sister Prior/Current Home Services: No current home services Social Determinants of Health Reivew: SDOH reviewed no interventions necessary Readmission risk has been reviewed: No (Currently calculated at 12% risk) Transition of care needs: transition of care needs identified, TOC will continue to follow [facility] recommendations per PT)

## 2023-03-16 NOTE — Consult Note (Addendum)
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 Pharmacy consulted to assist with electrolyte monitoring and replacement as indicated.  Goal of Therapy:  Electrolytes within normal limits  Plan:  K Phos Neutral 500 mg PO x 2 dose + KCL 20 mEq x 1 F/u with AM labs.    Ronnald Ramp, PharmD, BCPS 03/16/2023 9:19 AM

## 2023-03-17 ENCOUNTER — Telehealth: Payer: Self-pay | Admitting: Urology

## 2023-03-17 ENCOUNTER — Telehealth: Payer: Self-pay | Admitting: Medical

## 2023-03-17 LAB — CULTURE, BLOOD (ROUTINE X 2)
Culture: NO GROWTH
Culture: NO GROWTH

## 2023-03-17 NOTE — Telephone Encounter (Signed)
She is discharged over the weekend and she will need to follow-up with Dr. Apolinar Junes to discuss definitive management of her kidney stone.

## 2023-03-17 NOTE — Telephone Encounter (Signed)
Left voicemail to schedule hospital follow up

## 2023-03-17 NOTE — Telephone Encounter (Signed)
-----   Message from Cadence David Stall sent at 03/16/2023  7:12 AM EDT ----- Regarding: hosp follow-up Patient needs hosp follow-up in 2 weeks. thanks

## 2023-03-18 NOTE — Telephone Encounter (Signed)
Left message to call back to schedule.

## 2023-03-18 NOTE — Telephone Encounter (Signed)
Caryl Pina scheduled patient.

## 2023-03-21 ENCOUNTER — Ambulatory Visit: Payer: Medicare Other | Admitting: Urology

## 2023-03-21 ENCOUNTER — Other Ambulatory Visit
Admission: RE | Admit: 2023-03-21 | Discharge: 2023-03-21 | Disposition: A | Discharge status: Home / Self Care | Payer: Medicare Other | Attending: Urology | Admitting: Urology

## 2023-03-21 ENCOUNTER — Encounter: Payer: Self-pay | Admitting: Urology

## 2023-03-21 ENCOUNTER — Other Ambulatory Visit: Payer: Self-pay

## 2023-03-21 VITALS — BP 153/85 | HR 78 | Ht 63.0 in | Wt 120.0 lb

## 2023-03-21 DIAGNOSIS — N200 Calculus of kidney: Secondary | ICD-10-CM

## 2023-03-21 DIAGNOSIS — K089 Disorder of teeth and supporting structures, unspecified: Secondary | ICD-10-CM

## 2023-03-21 LAB — URINALYSIS, COMPLETE (UACMP) WITH MICROSCOPIC
Bilirubin Urine: NEGATIVE
Glucose, UA: NEGATIVE mg/dL
Ketones, ur: NEGATIVE mg/dL
Nitrite: NEGATIVE
Protein, ur: NEGATIVE mg/dL
Specific Gravity, Urine: 1.02 (ref 1.005–1.030)
pH: 5.5 (ref 5.0–8.0)

## 2023-03-21 NOTE — Progress Notes (Signed)
Marcelle Overlie Plume,acting as a scribe for Vanna Scotland, MD.,have documented all relevant documentation on the behalf of Vanna Scotland, MD,as directed by  Vanna Scotland, MD while in the presence of Vanna Scotland, MD.  03/21/2023 4:39 PM   Yvette Woodward [birth year] [id number]  Referring provider: Dortha Kern, MD [address] [place],  [place] [address]  Chief Complaint  Patient presents with   Nephrolithiasis    HPI: 77 year-old female who presents today to discuss definitive management of a kidney stone.   She was recently admitted with septic shock. She also had a non-obstructing 8 mm stone in the left renal pelvis. IT is 1400 Hounsfield units. Her urine cultures ended up growing E. Coli resistant to Bactrim and ampicillin. Please see her consult note for details.   She also had acute kidney injury with a creatinine up to 2.19 but ultimately improved back to baseline 0.58 prior to discharge.   Today, she reports not feeling well and initially complained of back pain while hospitalized. She has a history of kidney stones and previously underwent lithotripsy. She is currently on antibiotics, metoprolol, and Eliquis for AFib.   She has severe dental issues and plans to have all teeth extracted by an oral surgeon, pending cardiologist clearance and she is awaiting a cardiology appointment on October 22nd.   PMH: Past Medical History:  Diagnosis Date   Kidney stone    Renal disorder    kidney stones    Surgical History: Past Surgical History:  Procedure Laterality Date   LITHOTRIPSY     STOMACH SURGERY      Home Medications:  Allergies as of 03/21/2023       Reactions   Erythromycin Rash        Medication List        Accurate as of March 21, 2023  4:39 PM. If you have any questions, ask your nurse or doctor.          apixaban 5 MG Tabs tablet Commonly known as: ELIQUIS Take 1 tablet (5 mg total) by mouth 2 (two) times daily.   cephALEXin 500 MG  capsule Commonly known as: KEFLEX Take 1 capsule (500 mg total) by mouth 3 (three) times daily for 10 days.   cyanocobalamin 1000 MCG tablet Take 1 tablet (1,000 mcg total) by mouth daily.   feeding supplement Liqd Take 237 mLs by mouth 2 (two) times daily between meals.   metoprolol succinate 50 MG 24 hr tablet Commonly known as: TOPROL-XL Take 1 tablet (50 mg total) by mouth daily. Take with or immediately following a meal.   metroNIDAZOLE 500 MG tablet Commonly known as: FLAGYL Take 1 tablet (500 mg total) by mouth every 12 (twelve) hours for 10 days.   ondansetron 4 MG tablet Commonly known as: Zofran Take 1 tablet (4 mg total) by mouth every 8 (eight) hours as needed for nausea or vomiting.   polyethylene glycol 17 g packet Commonly known as: MIRALAX / GLYCOLAX Take 17 g by mouth daily as needed for moderate constipation.        Allergies:  Allergies  Allergen Reactions   Erythromycin Rash     Social History:  reports that she has never smoked. She has never used smokeless tobacco. She reports current alcohol use. No history on file for drug use.   Physical Exam: BP (!) 153/85 (BP Location: Left Arm, Patient Position: Sitting, Cuff Size: Normal)   Pulse 78   Ht 5\' 3"  (1.6 m)  Wt 120 lb (54.4 kg)   BMI 21.26 kg/m   Constitutional:  Alert and oriented, No acute distress. HEENT: [place] AT, moist mucus membranes.  Trachea midline, no masses. Neurologic: Grossly intact, no focal deficits, moving all 4 extremities. Psychiatric: Normal mood and affect.    Pertinent Imaging: EXAM: CT ABDOMEN AND PELVIS WITHOUT CONTRAST   TECHNIQUE: Multidetector CT imaging of the abdomen and pelvis was performed following the standard protocol without IV contrast.   RADIATION DOSE REDUCTION: This exam was performed according to the departmental dose-optimization program which includes automated exposure control, adjustment of the mA and/or kV according to patient size and/or  use of iterative reconstruction technique.   COMPARISON:  CT abdomen and pelvis dated 02/22/2017   FINDINGS: Lower chest: No focal consolidation or pulmonary nodule in the lung bases. No pleural effusion or pneumothorax demonstrated. Partially imaged heart size is normal. Coronary artery calcifications.   Hepatobiliary: No focal hepatic lesions. No intra or extrahepatic biliary ductal dilation. Normal gallbladder.   Pancreas: Similar prominence of the pancreatic duct measuring 3 mm.   Spleen: Normal in size without focal abnormality.   Adrenals/Urinary Tract: No adrenal nodules. No suspicious renal mass on this noncontrast enhanced examination or hydronephrosis. Multifocal gaseous foci within the left renal collecting system. 8 mm nonobstructing stone within the left renal pelvis, which demonstrates mural thickening. Intraluminal gas within the urinary bladder.   Stomach/Bowel: Metallic radiodensities in the gastric fundus, likely postsurgical. No evidence of bowel wall thickening, distention, or inflammatory changes. Appendix is not discretely seen. Diverticulosis without acute diverticulitis. Normal appendix.   Vascular/Lymphatic: Aortic atherosclerosis. No enlarged abdominal or pelvic lymph nodes.   Reproductive: No adnexal masses.   Other: No free fluid, fluid collection, or free air.   Musculoskeletal: No acute or abnormal lytic or blastic osseous lesions. Multilevel degenerative changes of the partially imaged thoracic and lumbar spine.   IMPRESSION: 1. Multifocal gaseous foci within the left renal collecting system and urinary bladder, which may be related to gas-forming infection given absent history of recent instrumentation. Recommend correlation with urinalysis and urine cultures. 2. An 8 mm nonobstructing stone within the left renal pelvis. 3. Diverticulosis without acute diverticulitis. 4. Aortic Atherosclerosis (ICD10-I70.0). Coronary  artery calcifications. Assessment for potential risk factor modification, dietary therapy or pharmacologic therapy may be warranted, if clinically indicated.   These results were called by telephone at the time of interpretation on 03/12/2023 at 4:58 pm to provider [facility] , who verbally acknowledged these results.     Electronically Signed   By: Agustin Cree M.D.   On: 03/12/2023 17:00  This was personally reviewed and I agree with the radiologic interpretation.    Assessment & Plan:    1. Kidney stones - 8 mm non-obstructing stone in the left renal pelvis - Plan to repeat urine culture closer to the time of surgery to ensure infection clearance. Antibiotics prior to procedure to minimize infection risk. - Severe dental issues noted, with plans for full extraction by an oral surgeon. Coordination with anesthesia team to determine if dental procedure should precede kidney stone management - Follow up in coordination with cardiology and oral surgery for clearance and scheduling of procedures. - We discussed various treatment options for urolithiasis including observation with or without medical expulsive therapy, shockwave lithotripsy (SWL), ureteroscopy and laser lithotripsy with stent placement, and percutaneous nephrolithotomy.   We discussed that management is based on stone size, location, density, patient co-morbidities, and patient preference.    Stones <68mm in  size have a >80% spontaneous passage rate. Data surrounding the use of tamsulosin for medical expulsive therapy is controversial, but meta analyses suggests it is most efficacious for distal stones between 5-70mm in size. Possible side effects include dizziness/lightheadedness, and retrograde ejaculation.   SWL has a lower stone free rate in a single procedure, but also a lower complication rate compared to ureteroscopy and avoids a stent and associated stent related symptoms. Possible complications include renal  hematoma, steinstrasse, and need for additional treatment. We discussed the role of his increased skin to stone distance can lead to decreased efficacy with shockwave lithotripsy.   Ureteroscopy with laser lithotripsy and stent placement has a higher stone free rate than SWL in a single procedure, however increased complication rate including possible infection, ureteral injury, bleeding, and stent related morbidity. Common stent related symptoms include dysuria, urgency/frequency, and flank pain.   After an extensive discussion of the risks and benefits of the above treatment options, the patient would like to proceed with ureteroscopy and laser lithotripsy with stent placement.   2. Poor dentition - It may be better if she has all her teeth pulled by her dentist prior to general anesthesia. We will check with the anesthesiologist on that.   3. Recent hospital admission - She does not have primary care. She has been approved for PT at home, but she does not have anybody to sign for it. - I agreed to sign it for her if she ends up faxing [place] that home health order, given that her admission was related to our diagnoses.    Return for ureteroscopy and laser lithotripsy.  [facility] [address], [address] [place], [place] [address] [phone]

## 2023-03-25 ENCOUNTER — Other Ambulatory Visit: Payer: Self-pay

## 2023-03-25 DIAGNOSIS — N200 Calculus of kidney: Secondary | ICD-10-CM

## 2023-03-25 NOTE — Progress Notes (Signed)
Surgical Physician Order Form [facility]  Dr. Vanna Scotland, MD  * Scheduling expectation : Next Available  *Length of Case:   *Clearance needed: yes; cardiology to hold Eliquis for A-fib.  Also, has very poor dentition.  Please curbside anesthesiology and assess whether or not they would prefer if she go ahead and have her teeth removed as planned first or treat kidney stone first.  See picture.  *Anticoagulation Instructions: N/A  *Aspirin Instructions: Ok to continue Aspirin  *Post-op visit Date/Instructions:  1 month with RUS prior  *Diagnosis: Left Nephrolithiasis  *Procedure: left Ureteroscopy w/laser lithotripsy & stent placement ([id number])   Additional orders: N/A  -Admit type: OUTpatient  -Anesthesia: General  -VTE Prophylaxis Standing Order SCD's       Other:   -Standing Lab Orders Per Anesthesia    Lab other: UA&Urine Culture  -Standing Test orders EKG/Chest x-ray per Anesthesia       Test other:   - Medications:  Ancef 2gm IV  -Other orders:  N/A

## 2023-03-25 NOTE — Progress Notes (Signed)
 09/03/23- Spoke with pt. Pt. Still needing dental work completed, she believes it will be several more months. She advised that she will call us to get back on the schedule at this time.   06/25/2023- Called patient to see how progress is going after 1st of the year. No answer and will try again. Patient is seeing dental on 07/10/23- will follow up after.   Spoke with Anesthesia, recommended to pull teeth prior to surgical procedure. Spoke with pt. Pt. Is seeing Cardiology on 10/22, will follow up after this date to see if any progress is made. Patient is agreeable to plan.

## 2023-03-27 ENCOUNTER — Telehealth: Payer: Self-pay | Admitting: Medical

## 2023-03-27 DIAGNOSIS — I4891 Unspecified atrial fibrillation: Secondary | ICD-10-CM

## 2023-03-27 NOTE — Telephone Encounter (Signed)
This patient was seen by you while she was in the hospital (9/26-9/29). She has an upcoming visit with you on 04/08/23 and she is requesting refills of Metoprolol and Eliquis that was originally prescribed by Dr. Renae Gloss while she was in the hospital. Please advise if ok to fill prescription under your name as the prescribing provider.

## 2023-03-27 NOTE — Telephone Encounter (Signed)
*  STAT* If patient is at the pharmacy, call can be transferred to refill team.   1. Which medications need to be refilled? (please list name of each medication and dose if known) apixaban (ELIQUIS) 5 MG TABS tablet   metoprolol succinate (TOPROL-XL) 50 MG 24 hr tablet   2. Which pharmacy/location (including street and city if local pharmacy) is medication to be sent to? [id number] - [place],  - [address]   3. Do they need a 30 day or 90 day supply? 90

## 2023-03-28 MED ORDER — METOPROLOL SUCCINATE ER 50 MG PO TB24: 50.0000 mg | ORAL_TABLET | Freq: Every day | ORAL | 0 refills | Status: DC

## 2023-03-28 MED ORDER — APIXABAN 5 MG PO TABS: 5.0000 mg | ORAL_TABLET | Freq: Two times a day (BID) | ORAL | 0 refills | Status: DC

## 2023-03-28 NOTE — Telephone Encounter (Signed)
Refill request for Eliquis

## 2023-03-28 NOTE — Telephone Encounter (Signed)
Eliquis 5mg  refill request received. Patient is 77 years old, weight-54.4kg, Crea-0.58 on 03/16/23, Diagnosis-Afib, and will be seen by Cadence Furth on 04/08/23-first appt. Dose is appropriate based on dosing criteria. Will send in refill to requested pharmacy.    Furth, Cadence H, PA-C  Rudd, Spiro D, CMA1 hour ago (8:24 AM)   Yes, OK to refill. Thanks

## 2023-03-31 ENCOUNTER — Telehealth: Payer: Self-pay | Admitting: Medical

## 2023-03-31 NOTE — Telephone Encounter (Signed)
Primary Cardiologist:None  Chart reviewed as part of pre-operative protocol coverage. Because of [name]'s past medical history and time since last visit, he/she will require a follow-up visit in order to better assess preoperative cardiovascular risk.  Pre-op covering staff: - Please schedule appointment and call patient to inform them. - Please contact requesting surgeon's office via preferred method (i.e, phone, fax) to inform them of need for appointment prior to surgery.  Message has been routed to pharmacy team to address Eliquis hold.  Levi Aland, NP-C  03/31/2023, 11:38 AM [address], [address] Office [phone] Fax [phone]

## 2023-03-31 NOTE — Telephone Encounter (Signed)
     Pre-operative Risk Assessment    Patient Name: Yvette Woodward  DOB: 12-01-1945 MRN: [id number]     Request for Surgical Clearance    Procedure:  Dental Extraction - Amount of Teeth to be Pulled:  15 teeth with alveoloplasty  Date of Surgery:  Clearance TBD                                 Surgeon:  Dr. Roni Bread Surgeon's Group or Practice Name:  [facility] Phone number:  [phone] Fax number:  [phone]   Type of Clearance Requested:   - Medical  - Pharmacy:  Hold defer to cards      Type of Anesthesia:  General    Additional requests/questions:   They also need the latest chart note and most recent lab results   Signed, Noe Gens   03/31/2023, 9:37 AM

## 2023-04-01 NOTE — Telephone Encounter (Signed)
Patient with diagnosis of afib on Eliquis for anticoagulation.    Procedure: Amount of Teeth to be Pulled:  15 teeth with alveoloplasty  Date of procedure: TBD   CHA2DS2-VASc Score = 3   This indicates a 3.2% annual risk of stroke. The patient's score is based upon: CHF History: 0 HTN History: 0 Diabetes History: 0 Stroke History: 0 Vascular Disease History: 0 Age Score: 2 Gender Score: 1      CrCl 67 ml/min  Patient does NOT require pre-op antibiotics for dental procedure.  Per office protocol, patient can hold Eliquis for 1 day prior to procedure.    **This guidance is not considered finalized until pre-operative APP has relayed final recommendations.**

## 2023-04-07 ENCOUNTER — Ambulatory Visit: Payer: Medicare Other | Attending: Medical | Admitting: Medical

## 2023-04-07 VITALS — BP 115/82 | HR 79 | Ht 63.0 in | Wt 118.0 lb

## 2023-04-07 DIAGNOSIS — R0789 Other chest pain: Secondary | ICD-10-CM

## 2023-04-07 DIAGNOSIS — I480 Paroxysmal atrial fibrillation: Secondary | ICD-10-CM | POA: Diagnosis not present

## 2023-04-07 DIAGNOSIS — Z01810 Encounter for preprocedural cardiovascular examination: Secondary | ICD-10-CM

## 2023-04-07 DIAGNOSIS — Z79899 Other long term (current) drug therapy: Secondary | ICD-10-CM

## 2023-04-07 MED ORDER — PANTOPRAZOLE SODIUM 20 MG PO TBEC: 20.0000 mg | DELAYED_RELEASE_TABLET | Freq: Every day | ORAL | 1 refills | Status: DC

## 2023-04-07 NOTE — Progress Notes (Signed)
Cardiology Office Note:    Date:  04/07/2023   ID:  Yvette Woodward, DOB 03/01/1946, MRN [id number]  PCP:  Dortha Kern, MD  [facility] Cardiologist:  None  [facility] Electrophysiologist:  None   Referring MD: Dortha Kern, MD   Chief Complaint: Hospital follow-up  History of Present Illness:    Yvette Woodward is a 77 y.o. female with a hx of newly diagnosed atrial fibrillation, kidney stones is being seen for hospital follow-up.  Patient was admitted in September 2020 for with septic shock, AKI, dental infection, malnutrition, thrombocytopenia, new onset A-fib, and nonobstructive kidney stone.  Patient briefly required vasopressors.  She was noted to have new onset A-fib started on IV heparin.  CHA2DS2-VASc of 3.  Echo showed EF of 55%.  Heparin was transition to Eliquis 5 mg twice daily.  Patient was started on rate control with Toprol 50 mg daily.  Patient was noted to have a dental infection leading all her teeth removed, recommended holding Eliquis at least 2 days prior to procedure.  Today, the patient is feeling much better. She is living with her sister. She is doing everything for herself now. She is almost done with PT. They are trying to her her into independent living. She sees the dentist tomorrow and is planning on surgery with anesthesia. She has occasional chest discomfort that can occur any time. Pain is not worse with exertion. She remains in NSR on EKG.   Past Medical History:  Diagnosis Date   Kidney stone    Renal disorder    kidney stones    Past Surgical History:  Procedure Laterality Date   LITHOTRIPSY     STOMACH SURGERY      Current Medications: Current Meds  Medication Sig   acetaminophen (TYLENOL) 650 MG CR tablet Take 650 mg by mouth every 8 (eight) hours as needed for pain.   Melatonin 10 MG CHEW Chew 10 mg by mouth at bedtime as needed (as needed for sleep).   pantoprazole (PROTONIX) 20 MG tablet Take 1 tablet (20 mg total) by  mouth daily.     Allergies:   Erythromycin   Social History   Socioeconomic History   Marital status: Single    Spouse name: Not on file   Number of children: Not on file   Years of education: Not on file   Highest education level: Not on file  Occupational History   Not on file  Tobacco Use   Smoking status: Never   Smokeless tobacco: Never  Vaping Use   Vaping status: Never Used  Substance and Sexual Activity   Alcohol use: Yes    Comment: occasionally   Drug use: Not on file   Sexual activity: Not Currently  Other Topics Concern   Not on file  Social History Narrative   Not on file   Social Determinants of Health   Financial Resource Strain: Not on file  Food Insecurity: No Food Insecurity (03/13/2023)   Hunger Vital Sign    Worried About Running Out of Food in the Last Year: Never true    Ran Out of Food in the Last Year: Never true  Transportation Needs: No Transportation Needs (03/13/2023)   PRAPARE - Administrator, Civil Service (Medical): No    Lack of Transportation (Non-Medical): No  Physical Activity: Not on file  Stress: Not on file  Social Connections: Not on file     Family History: The patient's family history  is not on file.  ROS:   Please see the history of present illness.     All other systems reviewed and are negative.  EKGs/Labs/Other Studies Reviewed:    The following studies were reviewed today:  Echo 02/2023 1. Left ventricular ejection fraction, by estimation, is 55 to 60%. The  left ventricle has normal function. The left ventricle has no regional  wall motion abnormalities. There is mild left ventricular hypertrophy.  Left ventricular diastolic parameters  were normal. The average left ventricular global longitudinal strain is  -16.5 %. The global longitudinal strain is normal.   2. Right ventricular systolic function is normal. The right ventricular  size is normal.   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   4. The aortic valve is tricuspid. Aortic valve regurgitation is not  visualized.   5. The inferior vena cava is normal in size with greater than 50%  respiratory variability, suggesting right atrial pressure of 3 mmHg.   EKG:  EKG is ordered today.  The ekg ordered today demonstrates NSR 78bpm, LAD, TWI inferior leads   Recent Labs: 03/13/2023: TSH 2.482 03/14/2023: Magnesium 2.2 03/16/2023: BUN 9; Creatinine, Ser 0.58; Hemoglobin 12.0; Platelets 134; Potassium 3.3; Sodium 139  Recent Lipid Panel No results found for: "CHOL", "TRIG", "HDL", "CHOLHDL", "VLDL", "LDLCALC", "LDLDIRECT"    Physical Exam:    VS:  BP 115/82 (BP Location: Left Arm, Patient Position: Sitting, Cuff Size: Normal)   Pulse 79   Ht 5\' 3"  (1.6 m)   Wt 118 lb (53.5 kg)   SpO2 99%   BMI 20.90 kg/m     Wt Readings from Last 3 Encounters:  04/07/23 118 lb (53.5 kg)  03/21/23 120 lb (54.4 kg)  03/15/23 126 lb 5.2 oz (57.3 kg)     GEN:  Well nourished, well developed in no acute distress HEENT: Normal NECK: No JVD; No carotid bruits LYMPHATICS: No lymphadenopathy CARDIAC: RRR, no murmurs, rubs, gallops RESPIRATORY:  Clear to auscultation without rales, wheezing or rhonchi  ABDOMEN: Soft, non-tender, non-distended MUSCULOSKELETAL:  No edema; No deformity  SKIN: Warm and dry NEUROLOGIC:  Alert and oriented x 3 PSYCHIATRIC:  Normal affect   ASSESSMENT:    1. Paroxysmal atrial fibrillation (HCC)   2. Atypical chest pain   3. Medication management   4. Pre-operative cardiovascular examination    PLAN:    In order of problems listed above:  Paroxysmal Afib Recently diagnosed with new onset afib during hospitalization with multiple acute issues. She self converted and was started on Eliquis and Toprol. CHADSVASC of 3. She remains in NSR today. She denies bleeding issues with Eliquis. Continue Toprol for rate  control.   Chest pain She describes atypical chest pain. Anxiety or GERD may be contributing. I will add a PPI. IF she is still having chest pain at follow-up, can consider ischemic evaluation.   Pre-operative cardiac evaluation Patient needs 15 teeth pulled. She sees the dentist tomorrow and they will schedule the procedure. She is on Eliquis for Afib. Per pharmacy, OK to hold Eliquis 1 day prior to procedure. METS>4. According to the RCRI, the patient is class 1 risk with 3.9% 30 day risk of death, MI, or cardiac arrest. No further cardiac work-up required prior to surgery.   Disposition: Follow up in 2 month(s) with MD/APP    Signed, Sameer Teeple David Stall, PA-C  04/07/2023 2:50 PM  [facility]

## 2023-04-07 NOTE — Patient Instructions (Signed)
Medication Instructions:  Your physician recommends the following medication changes.  START TAKING: Protonix 20 mg by mouth daily  *If you need a refill on your cardiac medications before your next appointment, please call your pharmacy*   Lab Work: Your provider would like for you to have following labs drawn today (CBC, CMP).     Testing/Procedures: No test ordered today    Follow-Up: At [facility], you and your health needs are our priority.  As part of our continuing mission to provide you with exceptional heart care, we have created designated Provider Care Teams.  These Care Teams include your primary Cardiologist (physician) and Advanced Practice Providers (APPs -  Physician Assistants and Nurse Practitioners) who all work together to provide you with the care you need, when you need it.  We recommend signing up for the patient portal called "MyChart".  Sign up information is provided on this After Visit Summary.  MyChart is used to connect with patients for Virtual Visits (Telemedicine).  Patients are able to view lab/test results, encounter notes, upcoming appointments, etc.  Non-urgent messages can be sent to your provider as well.   To learn more about what you can do with MyChart, go to [email or web address].    Your next appointment:   2 month(s)  Provider:   Terrilee Croak, PA-C

## 2023-04-08 ENCOUNTER — Ambulatory Visit: Payer: Medicare Other | Admitting: Medical

## 2023-04-08 LAB — COMPREHENSIVE METABOLIC PANEL
ALT: 18 [IU]/L (ref 0–32)
AST: 21 [IU]/L (ref 0–40)
Albumin: 4.3 g/dL (ref 3.8–4.8)
Alkaline Phosphatase: 60 [IU]/L (ref 44–121)
BUN/Creatinine Ratio: 26 (ref 12–28)
BUN: 21 mg/dL (ref 8–27)
Bilirubin Total: 0.3 mg/dL (ref 0.0–1.2)
CO2: 19 mmol/L — ABNORMAL LOW (ref 20–29)
Calcium: 9.7 mg/dL (ref 8.7–10.3)
Chloride: 105 mmol/L (ref 96–106)
Creatinine, Ser: 0.8 mg/dL (ref 0.57–1.00)
Globulin, Total: 2.3 g/dL (ref 1.5–4.5)
Glucose: 101 mg/dL — ABNORMAL HIGH (ref 70–99)
Potassium: 4.6 mmol/L (ref 3.5–5.2)
Sodium: 140 mmol/L (ref 134–144)
Total Protein: 6.6 g/dL (ref 6.0–8.5)
eGFR: 76 mL/min/{1.73_m2} (ref >59)

## 2023-04-08 LAB — CBC
Hematocrit: 42.9 % (ref 34.0–46.6)
Hemoglobin: 14 g/dL (ref 11.1–15.9)
MCH: 31.8 pg (ref 26.6–33.0)
MCHC: 32.6 g/dL (ref 31.5–35.7)
MCV: 98 fL — ABNORMAL HIGH (ref 79–97)
Platelets: 260 10*3/uL (ref 150–450)
RBC: 4.4 x10E6/uL (ref 3.77–5.28)
RDW: 13.5 % (ref 11.7–15.4)
WBC: 7.5 10*3/uL (ref 3.4–10.8)

## 2023-04-08 NOTE — Telephone Encounter (Signed)
Requesting office called in for status on Clearance request.

## 2023-04-08 NOTE — Telephone Encounter (Signed)
I will forward to pre op APP and Cadence Furth, PAC in regard to if pt has been cleared.

## 2023-04-09 NOTE — Telephone Encounter (Signed)
Preoperative team, patient was seen in clinic on 04/07/2023.  Preoperative cardiac evaluation was addressed at that time.  Please forward clinic note to requesting office.  Thank you for your help.  Recommendations for anticoagulation hold are also included.  Thomasene Ripple. Diamonique Ruedas NP-C     04/09/2023, 8:05 AM [facility] [address] Office [phone] Fax [phone]

## 2023-04-09 NOTE — Telephone Encounter (Signed)
Office visit note, 04/07/23, has been sent to requesting surgeon's office.

## 2023-04-22 ENCOUNTER — Telehealth: Payer: Self-pay | Admitting: Medical

## 2023-04-22 NOTE — Telephone Encounter (Signed)
Patient was calling with concerns about a white rash/ring around her mouth. Looking to speak to the nurse. Please advise

## 2023-04-22 NOTE — Telephone Encounter (Signed)
Patient called reporting the development of a white ring around her mouth and a patch on her tongue. The patient stated she believes it could be thrush, but is unsure.  The patient also mentioned that she does not have a primary care provider (PCP). The nurse recommended evaluation at Urgent Care. The patient verbalized understanding.

## 2023-05-07 ENCOUNTER — Other Ambulatory Visit: Payer: Self-pay

## 2023-05-07 ENCOUNTER — Telehealth: Payer: Self-pay | Admitting: Medical

## 2023-05-07 MED ORDER — METOPROLOL SUCCINATE ER 50 MG PO TB24: 50.0000 mg | ORAL_TABLET | Freq: Every day | ORAL | 0 refills | Status: DC

## 2023-05-07 NOTE — Telephone Encounter (Signed)
Patient called and wanted to know if there was anyway the could change her metoprolol from 30 day to 90 day

## 2023-05-07 NOTE — Telephone Encounter (Signed)
Spoke with Pt. Pt will run out of medication next week. Called in 90 day without refill in case of next or adjustment at next office visit 06/26/23 and to get to that appointment.

## 2023-06-25 NOTE — Progress Notes (Signed)
 Cardiology Office Note:    Date:  06/26/2023   ID:  [name], DOB February 19, 1946, MRN [id number]  PCP:  Derick Leita POUR, MD  [facility] Cardiologist:  None  [facility] Electrophysiologist:  None   Referring MD: Derick Leita POUR, MD   Chief Complaint: 2 month follow-up  History of Present Illness:    Yvette Woodward is a 78 y.o. female with a hx of atrial fibrillation, kidney stones is being seen for follow-up.   Patient was admitted in September 2024 with septic shock, AKI, dental infection, malnutrition, thrombocytopenia, new onset A-fib, and nonobstructive kidney stone.  Patient briefly required vasopressors.  She was noted to have new onset A-fib started on IV heparin .  CHA2DS2-VASc of 3.  Echo showed EF of 55%.  Heparin  was transition to Eliquis  5 mg twice daily.  Patient was started on rate control with Toprol  50 mg daily.  Patient was noted to have a dental infection leading all her teeth removed, recommended holding Eliquis  at least 2 days prior to procedure.  The patient was last seen 04/07/23 and was feeling much better. She was in NSR. She reported atypical chest pain and PPI was added. It was recommended (per pharmacy) patient hold Eliquis  1 day prior to the procedure.  Today, the patient is overall doing well. She is at [facility] at independent living. She has gained 3 lbs. She is 121lbs. She likes to walk and is trying to get back into this. She is having trouble sleeping. No chest pain, SOB, lower leg edema. She feels energy is lower. Feels that when she gets back to walking this might improve.   Past Medical History:  Diagnosis Date   Kidney stone    Renal disorder    kidney stones    Past Surgical History:  Procedure Laterality Date   LITHOTRIPSY     MULTIPLE TOOTH EXTRACTIONS     STOMACH SURGERY      Current Medications: Current Meds  Medication Sig   apixaban  (ELIQUIS ) 5 MG TABS tablet Take 1 tablet (5 mg total) by mouth 2 (two) times daily.    cyanocobalamin  1000 MCG tablet Take 1 tablet (1,000 mcg total) by mouth daily.   feeding supplement (ENSURE ENLIVE / ENSURE PLUS) LIQD Take 237 mLs by mouth 2 (two) times daily between meals.   metoprolol  succinate (TOPROL -XL) 50 MG 24 hr tablet Take 1 tablet (50 mg total) by mouth daily. Take with or immediately following a meal.     Allergies:   Erythromycin   Social History   Socioeconomic History   Marital status: Single    Spouse name: Not on file   Number of children: Not on file   Years of education: Not on file   Highest education level: Not on file  Occupational History   Not on file  Tobacco Use   Smoking status: Never   Smokeless tobacco: Never  Vaping Use   Vaping status: Never Used  Substance and Sexual Activity   Alcohol use: Yes    Comment: occasionally   Drug use: Not on file   Sexual activity: Not Currently  Other Topics Concern   Not on file  Social History Narrative   Not on file   Social Drivers of Health   Financial Resource Strain: Not on file  Food Insecurity: No Food Insecurity (03/13/2023)   Hunger Vital Sign    Worried About Running Out of Food in the Last Year: Never true    Ran  Out of Food in the Last Year: Never true  Transportation Needs: No Transportation Needs (03/13/2023)   PRAPARE - Administrator, Civil Service (Medical): No    Lack of Transportation (Non-Medical): No  Physical Activity: Not on file  Stress: Not on file  Social Connections: Not on file     Family History: The patient's family history is not on file.  ROS:   Please see the history of present illness.     All other systems reviewed and are negative.  EKGs/Labs/Other Studies Reviewed:    The following studies were reviewed today:   Echo 02/2023 1. Left ventricular ejection fraction, by estimation, is 55 to 60%. The  left ventricle has normal function. The left ventricle has no regional  wall motion abnormalities. There is mild left ventricular  hypertrophy.  Left ventricular diastolic parameters  were normal. The average left ventricular global longitudinal strain is  -16.5 %. The global longitudinal strain is normal.   2. Right ventricular systolic function is normal. The right ventricular  size is normal.   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   4. The aortic valve is tricuspid. Aortic valve regurgitation is not  visualized.   5. The inferior vena cava is normal in size with greater than 50%  respiratory variability, suggesting right atrial pressure of 3 mmHg.   EKG:  EKG is ordered today.  The ekg ordered today demonstrates NSR 81bpm, LAD, nonspecific T wave changes  Recent Labs: 03/13/2023: TSH 2.482 03/14/2023: Magnesium  2.2 04/07/2023: ALT 18; BUN 21; Creatinine, Ser 0.80; Hemoglobin 14.0; Platelets 260; Potassium 4.6; Sodium 140  Recent Lipid Panel No results found for: CHOL, TRIG, HDL, CHOLHDL, VLDL, LDLCALC, LDLDIRECT   Physical Exam:    VS:  BP (!) 136/94   Pulse 81   Ht 5' 3 (1.6 m)   Wt 121 lb (54.9 kg)   SpO2 98%   BMI 21.43 kg/m     Wt Readings from Last 3 Encounters:  06/26/23 121 lb (54.9 kg)  04/07/23 118 lb (53.5 kg)  03/21/23 120 lb (54.4 kg)     GEN:  Well nourished, well developed in no acute distress HEENT: Normal NECK: No JVD; No carotid bruits LYMPHATICS: No lymphadenopathy CARDIAC: RRR, no murmurs, rubs, gallops RESPIRATORY:  Clear to auscultation without rales, wheezing or rhonchi  ABDOMEN: Soft, non-tender, non-distended MUSCULOSKELETAL:  No edema; No deformity  SKIN: Warm and dry NEUROLOGIC:  Alert and oriented x 3 PSYCHIATRIC:  Normal affect   ASSESSMENT:    1. Paroxysmal atrial fibrillation (HCC)    PLAN:    In order of problems listed above:  Paroxysmal Afib Patient is in normal sinus rhythm on EKG today.  Continue Eliquis  5 mg twice daily for  stroke prophylaxis.  Continue Toprol  50 mg daily for rate control.  Will make sure these medications are 90 days with refills.  Patient denies any chest pain, shortness of breath, palpitations, lightheadedness, dizziness.  She feels generally tired, but may be from deconditioning.  She likes to walk and plans on regular walking to build up endurance.  Disposition: Follow up in 3 month(s) with MD/APP   Signed, Rosemarie Galvis VEAR Fishman, PA-C  06/26/2023 11:59 AM    [facility]

## 2023-06-26 ENCOUNTER — Encounter: Payer: Self-pay | Admitting: Medical

## 2023-06-26 ENCOUNTER — Ambulatory Visit: Payer: PPO | Attending: Medical | Admitting: Medical

## 2023-06-26 VITALS — BP 136/94 | HR 81 | Ht 63.0 in | Wt 121.0 lb

## 2023-06-26 DIAGNOSIS — I480 Paroxysmal atrial fibrillation: Secondary | ICD-10-CM

## 2023-06-26 DIAGNOSIS — I4891 Unspecified atrial fibrillation: Secondary | ICD-10-CM

## 2023-06-26 MED ORDER — APIXABAN 5 MG PO TABS: 5.0000 mg | ORAL_TABLET | Freq: Two times a day (BID) | ORAL | 3 refills | Status: DC

## 2023-06-26 MED ORDER — METOPROLOL SUCCINATE ER 50 MG PO TB24: 50.0000 mg | ORAL_TABLET | Freq: Every day | ORAL | 3 refills | Status: DC

## 2023-06-26 NOTE — Patient Instructions (Signed)
 Medication Instructions:  Your Physician recommend you continue on your current medication as directed.    *If you need a refill on your cardiac medications before your next appointment, please call your pharmacy*   Lab Work: None ordered at this time   Follow-Up: At [facility], you and your health needs are our priority.  As part of our continuing mission to provide you with exceptional heart care, we have created designated Provider Care Teams.  These Care Teams include your primary Cardiologist (physician) and Advanced Practice Providers (APPs -  Physician Assistants and Nurse Practitioners) who all work together to provide you with the care you need, when you need it.  We recommend signing up for the patient portal called MyChart.  Sign up information is provided on this After Visit Summary.  MyChart is used to connect with patients for Virtual Visits (Telemedicine).  Patients are able to view lab/test results, encounter notes, upcoming appointments, etc.  Non-urgent messages can be sent to your provider as well.   To learn more about what you can do with MyChart, go to [email or web address].    Your next appointment:   3 month(s)  Provider:   You may see one of the following Advanced Practice Providers on your designated Care Team:   Lonni Meager, NP Bernardino Bring, PA-C Cadence Franchester, PA-C Tylene Lunch, NP Barnie Hila, NP

## 2023-07-17 DIAGNOSIS — Z79899 Other long term (current) drug therapy: Secondary | ICD-10-CM | POA: Diagnosis not present

## 2023-07-17 DIAGNOSIS — H9313 Tinnitus, bilateral: Secondary | ICD-10-CM | POA: Diagnosis not present

## 2023-07-17 DIAGNOSIS — Z1389 Encounter for screening for other disorder: Secondary | ICD-10-CM | POA: Diagnosis not present

## 2023-07-17 DIAGNOSIS — Z1211 Encounter for screening for malignant neoplasm of colon: Secondary | ICD-10-CM | POA: Diagnosis not present

## 2023-07-17 DIAGNOSIS — Z1231 Encounter for screening mammogram for malignant neoplasm of breast: Secondary | ICD-10-CM | POA: Diagnosis not present

## 2023-07-17 DIAGNOSIS — I480 Paroxysmal atrial fibrillation: Secondary | ICD-10-CM | POA: Diagnosis not present

## 2023-07-17 DIAGNOSIS — H9193 Unspecified hearing loss, bilateral: Secondary | ICD-10-CM | POA: Diagnosis not present

## 2023-07-17 DIAGNOSIS — N200 Calculus of kidney: Secondary | ICD-10-CM | POA: Diagnosis not present

## 2023-07-17 DIAGNOSIS — R739 Hyperglycemia, unspecified: Secondary | ICD-10-CM | POA: Diagnosis not present

## 2023-07-17 DIAGNOSIS — Z0000 Encounter for general adult medical examination without abnormal findings: Secondary | ICD-10-CM | POA: Diagnosis not present

## 2023-07-18 ENCOUNTER — Other Ambulatory Visit: Payer: Self-pay | Admitting: Internal Medicine

## 2023-07-18 DIAGNOSIS — Z1231 Encounter for screening mammogram for malignant neoplasm of breast: Secondary | ICD-10-CM

## 2023-09-03 NOTE — Progress Notes (Signed)
 Tried calling patient, left detailed message to call us about getting on the schedule for at least a follow up visit.

## 2023-09-24 ENCOUNTER — Encounter: Payer: Self-pay | Admitting: Medical

## 2023-09-24 ENCOUNTER — Ambulatory Visit: Payer: PPO | Attending: Medical | Admitting: Medical

## 2023-09-24 VITALS — BP 120/88 | HR 82 | Ht 63.0 in | Wt 129.4 lb

## 2023-09-24 DIAGNOSIS — I480 Paroxysmal atrial fibrillation: Secondary | ICD-10-CM

## 2023-09-24 DIAGNOSIS — R0789 Other chest pain: Secondary | ICD-10-CM | POA: Diagnosis not present

## 2023-09-24 NOTE — Patient Instructions (Signed)
 Medication Instructions:   Your Physician recommend you continue on your current medication as directed.     *If you need a refill on your cardiac medications before your next appointment, please call your pharmacy*  Lab Work: None ordered.  If you have labs (blood work) drawn today and your tests are completely normal, you will receive your results only by: MyChart Message (if you have MyChart) OR A paper copy in the mail If you have any lab test that is abnormal or we need to change your treatment, we will call you to review the results.  Testing/Procedures:    Please report to Radiology at the [facility] Main Entrance 30 minutes early for your test.  [address] [place], [place] [address]                         OR   Please report to Radiology at [facility] Main Entrance, medical mall, 30 mins prior to your test.  [address]  [place], [place]  How to Prepare for Your Cardiac PET/CT Stress Test:  Nothing to eat or drink, except water, 3 hours prior to arrival time.  NO caffeine/decaffeinated products, or chocolate 12 hours prior to arrival. (Please note decaffeinated beverages (teas/coffees) still contain caffeine).  If you have caffeine within 12 hours prior, the test will need to be rescheduled.  Medication instructions: Do not take erectile dysfunction medications for 72 hours prior to test (sildenafil, tadalafil) Do not take nitrates (isosorbide mononitrate, Ranexa) the day before or day of test Do not take tamsulosin the day before or morning of test Hold theophylline containing medications for 12 hours. Hold Dipyridamole 48 hours prior to the test.  Diabetic Preparation: If able to eat breakfast prior to 3 hour fasting, you may take all medications, including your insulin. Do not worry if you miss your breakfast dose of insulin - start at your next meal. If you do not eat prior to 3 hour fast-Hold all diabetes (oral and  insulin) medications. Patients who wear a continuous glucose monitor MUST remove the device prior to scanning.  You may take your remaining medications with water.  NO perfume, cologne or lotion on chest or abdomen area. FEMALES - Please avoid wearing dresses to this appointment.  Total time is 1 to 2 hours; you may want to bring reading material for the waiting time.  IF YOU THINK YOU MAY BE PREGNANT, OR ARE NURSING PLEASE INFORM THE TECHNOLOGIST.  In preparation for your appointment, medication and supplies will be purchased.  Appointment availability is limited, so if you need to cancel or reschedule, please call the Radiology Department Scheduler at [phone] 24 hours in advance to avoid a cancellation fee of $100.00  What to Expect When you Arrive:  Once you arrive and check in for your appointment, you will be taken to a preparation room within the Radiology Department.  A technologist or Nurse will obtain your medical history, verify that you are correctly prepped for the exam, and explain the procedure.  Afterwards, an IV will be started in your arm and electrodes will be placed on your skin for EKG monitoring during the stress portion of the exam. Then you will be escorted to the PET/CT scanner.  There, staff will get you positioned on the scanner and obtain a blood pressure and EKG.  During the exam, you will continue to be connected to the EKG and blood pressure machines.  A small, safe amount of a radioactive tracer will be injected in your IV to obtain a series of pictures of your heart along with an injection of a stress agent.    After your Exam:  It is recommended that you eat a meal and drink a caffeinated beverage to counter act any effects of the stress agent.  Drink plenty of fluids for the remainder of the day and urinate frequently for the first couple of hours after the exam.  Your doctor will inform you of your test results within 7-10 business days.  For more  information and frequently asked questions, please visit our website: [email or web address]  For questions about your test or how to prepare for your test, please call: Cardiac Imaging Nurse Navigators Office: [phone]   Follow-Up: At [facility], you and your health needs are our priority.  As part of our continuing mission to provide you with exceptional heart care, our providers are all part of one team.  This team includes your primary Cardiologist (physician) and Advanced Practice Providers or APPs (Physician Assistants and Nurse Practitioners) who all work together to provide you with the care you need, when you need it.  Your next appointment:   1 month(s)  Provider:      Cadence Fransico Michael, PA-C  We recommend signing up for the patient portal called "MyChart".  Sign up information is provided on this After Visit Summary.  MyChart is used to connect with patients for Virtual Visits (Telemedicine).  Patients are able to view lab/test results, encounter notes, upcoming appointments, etc.  Non-urgent messages can be sent to your provider as well.   To learn more about what you can do with MyChart, go to [email or web address].

## 2023-09-24 NOTE — Progress Notes (Signed)
 Cardiology Office Note:  .   Date:  09/24/2023  ID:  Yvette Woodward, DOB 1946/05/06, MRN [id number] PCP: Dortha Kern, MD  [facility] Providers Cardiologist:  None     History of Present Illness: .   Yvette Woodward is a 78 y.o. female with a hx of atrial fibrillation, kidney stones is being seen for follow-up for Afib.   Patient was admitted in September 2024 with septic shock, AKI, dental infection, malnutrition, thrombocytopenia, new onset A-fib, and nonobstructive kidney stone.  Patient briefly required vasopressors.  She was noted to have new onset A-fib started on IV heparin.  CHA2DS2-VASc of 3.  Echo showed EF of 55%.  Heparin was transition to Eliquis 5 mg twice daily.  Patient was started on rate control with Toprol 50 mg daily.     The patient was seen 04/07/23 and was feeling much better. She was in NSR. She reported atypical chest pain and PPI was added.   The patient was last seen 06/26/23 and was overall doing well. She gained 3lbs at [facility] independent living.   Today, the patient reports chest tightness/pressure that occurs every 12-24 hours. It feels like a pressure and also feels like a burning. It happens early morning or at night. It lasts 2-3 minutes. She does not feel it is similar to GERD. She feels worried about her overall health and finances.She is at [facility] and has gained 10 lbs.    Studies Reviewed: Marland Kitchen   EKG Interpretation Date/Time:  Wednesday September 24 2023 11:31:01 EDT Ventricular Rate:  78 PR Interval:  158 QRS Duration:  74 QT Interval:  372 QTC Calculation: 424 R Axis:   -43  Text Interpretation: Normal sinus rhythm Left axis deviation Pulmonary disease pattern When compared with ECG of 26-Jun-2023 11:37, No significant change was found Confirmed by Fransico Michael, Vishruth Seoane ([id number]) on 09/24/2023 11:35:35 AM    Echo 02/2023 1. Left ventricular ejection fraction, by estimation, is 55 to 60%. The  left ventricle has normal function. The left  ventricle has no regional  wall motion abnormalities. There is mild left ventricular hypertrophy.  Left ventricular diastolic parameters  were normal. The average left ventricular global longitudinal strain is  -16.5 %. The global longitudinal strain is normal.   2. Right ventricular systolic function is normal. The right ventricular  size is normal.   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   4. The aortic valve is tricuspid. Aortic valve regurgitation is not  visualized.   5. The inferior vena cava is normal in size with greater than 50%  respiratory variability, suggesting right atrial pressure of 3 mmHg.        Physical Exam:   VS:  BP 120/88   Pulse 82   Ht 5\' 3"  (1.6 m)   Wt 129 lb 6.4 oz (58.7 kg)   SpO2 97%   BMI 22.92 kg/m    Wt Readings from Last 3 Encounters:  09/24/23 129 lb 6.4 oz (58.7 kg)  06/26/23 121 lb (54.9 kg)  04/07/23 118 lb (53.5 kg)    GEN: Well nourished, well developed in no acute distress NECK: No JVD; No carotid bruits CARDIAC: RRR, no murmurs, rubs, gallops RESPIRATORY:  Clear to auscultation without rales, wheezing or rhonchi  ABDOMEN: Soft, non-tender, non-distended EXTREMITIES:  No edema; No deformity   ASSESSMENT AND PLAN: .    Chest  pain The patient reports months of chest pressure/burning that can happen throughout the day. She does not feels this is GERD. Stress may be contributing. She reports previous reaction to dye. I will order a Cardiac PET stress test.   Paroxysmal Afib She is in NSR on EKG today. Continue Eliquis 5mg  BID and Toprol 50mg  daily.     Informed Consent   Shared Decision Making/Informed Consent The risks [chest pain, shortness of breath, cardiac arrhythmias, dizziness, blood pressure fluctuations, myocardial infarction, stroke/transient ischemic attack, nausea, vomiting, allergic reaction, radiation exposure,  metallic taste sensation and life-threatening complications (estimated to be 1 in 10,000)], benefits (risk stratification, diagnosing coronary artery disease, treatment guidance) and alternatives of a cardiac PET stress test were discussed in detail with Yvette Woodward and she agrees to proceed.     Dispo: Follow-up in 1 month  Signed, Aikam Hellickson David Stall, PA-C

## 2023-10-17 DIAGNOSIS — H9313 Tinnitus, bilateral: Secondary | ICD-10-CM | POA: Diagnosis not present

## 2023-10-17 DIAGNOSIS — N200 Calculus of kidney: Secondary | ICD-10-CM | POA: Diagnosis not present

## 2023-10-17 DIAGNOSIS — I480 Paroxysmal atrial fibrillation: Secondary | ICD-10-CM | POA: Diagnosis not present

## 2023-10-17 DIAGNOSIS — F5102 Adjustment insomnia: Secondary | ICD-10-CM | POA: Diagnosis not present

## 2023-10-30 ENCOUNTER — Ambulatory Visit: Admitting: Medical

## 2023-11-18 ENCOUNTER — Encounter (HOSPITAL_COMMUNITY): Payer: Self-pay

## 2023-11-19 ENCOUNTER — Telehealth (HOSPITAL_COMMUNITY): Payer: Self-pay | Admitting: *Deleted

## 2023-11-19 NOTE — Telephone Encounter (Signed)
Patient returning call about her upcoming cardiac imaging study; pt verbalizes understanding of appt date/time, parking situation and where to check in, pre-test NPO status and verified current allergies; name and call back number provided for further questions should they arise  Naji Mehringer RN Navigator Cardiac Imaging [facility] [phone] office [phone] cell  Patient aware to avoid caffeine 12 hours prior to her cardiac PET scan. 

## 2023-11-19 NOTE — Telephone Encounter (Signed)
Attempted to call patient regarding upcoming cardiac PET appointment. Left message on voicemail with name and callback number  Larey Brick RN Navigator Cardiac Imaging [facility] [phone] Office [phone] Cell  Reminder to avoid caffeine 12 hours prior to her cardiac PET study.

## 2023-11-20 ENCOUNTER — Ambulatory Visit
Admission: RE | Admit: 2023-11-20 | Discharge: 2023-11-20 | Disposition: A | Discharge status: Home / Self Care | Source: Ambulatory Visit | Attending: Medical | Admitting: Medical

## 2023-11-20 DIAGNOSIS — R0789 Other chest pain: Secondary | ICD-10-CM | POA: Diagnosis not present

## 2023-11-20 LAB — NM PET CT CARDIAC PERFUSION MULTI W/ABSOLUTE BLOODFLOW
LV dias vol: 62 mL (ref 46–106)
LV sys vol: 21 mL
MBFR: 1.37
Nuc Rest EF: 67 %
Nuc Stress EF: 66 %
Peak HR: 105 {beats}/min
Rest HR: 84 {beats}/min
Rest MBF: 1.88 ml/g/min
Rest Nuclear Isotope Dose: 15.4 mCi
SRS: 6
SSS: 18
ST Depression (mm): 0 mm
Stress MBF: 2.57 ml/g/min
Stress Nuclear Isotope Dose: 15.1 mCi
TID: 1.06

## 2023-11-20 MED ORDER — RUBIDIUM RB82 GENERATOR (RUBYFILL)
25.0000 | PACK | Freq: Once | INTRAVENOUS | Status: AC
  Administered 2023-11-20: 15.14 via INTRAVENOUS

## 2023-11-20 MED ORDER — REGADENOSON 0.4 MG/5ML IV SOLN
0.4000 mg | Freq: Once | INTRAVENOUS | Status: AC
  Administered 2023-11-20: 0.4 mg via INTRAVENOUS
  Filled 2023-11-20: qty 5

## 2023-11-20 MED ORDER — REGADENOSON 0.4 MG/5ML IV SOLN
INTRAVENOUS | Status: AC
  Filled 2023-11-20: qty 5

## 2023-11-20 MED ORDER — RUBIDIUM RB82 GENERATOR (RUBYFILL)
25.0000 | PACK | Freq: Once | INTRAVENOUS | Status: AC
  Administered 2023-11-20: 15.41 via INTRAVENOUS

## 2023-11-25 ENCOUNTER — Ambulatory Visit: Payer: Self-pay | Admitting: Medical

## 2023-12-04 ENCOUNTER — Encounter: Payer: Self-pay | Admitting: Medical

## 2023-12-04 ENCOUNTER — Ambulatory Visit: Attending: Medical | Admitting: Medical

## 2023-12-04 VITALS — BP 134/88 | HR 77 | Ht 63.0 in | Wt 135.4 lb

## 2023-12-04 DIAGNOSIS — R9439 Abnormal result of other cardiovascular function study: Secondary | ICD-10-CM | POA: Diagnosis not present

## 2023-12-04 DIAGNOSIS — R0789 Other chest pain: Secondary | ICD-10-CM | POA: Diagnosis not present

## 2023-12-04 DIAGNOSIS — I4891 Unspecified atrial fibrillation: Secondary | ICD-10-CM

## 2023-12-04 MED ORDER — NITROGLYCERIN 0.4 MG SL SUBL: 0.4000 mg | SUBLINGUAL_TABLET | SUBLINGUAL | 3 refills | Status: AC | PRN

## 2023-12-04 MED ORDER — ISOSORBIDE MONONITRATE ER 30 MG PO TB24: 15.0000 mg | ORAL_TABLET | Freq: Every day | ORAL | 3 refills | Status: DC

## 2023-12-04 NOTE — Progress Notes (Signed)
 Cardiology Office Note   Date:  12/04/2023  ID:  Yvette Woodward, DOB 01-05-1946, MRN [id number] PCP: Claudine Cullens, MD  [facility] Providers Cardiologist:  None   History of Present Illness Yvette Woodward is a 78 y.o. female  with a hx of atrial fibrillation, kidney stones is being seen for follow-up for abnormal stress test   Patient was admitted in September 2024 with septic shock, AKI, dental infection, malnutrition, thrombocytopenia, new onset A-fib, and nonobstructive kidney stone.  Patient briefly required vasopressors.  She was noted to have new onset A-fib started on IV heparin .  CHA2DS2-VASc of 3.  Echo showed EF of 55%.  Heparin  was transition to Eliquis  5 mg twice daily.  Patient was started on rate control with Toprol  50 mg daily.     The patient was seen 04/07/23 and was feeling much better. She was in NSR. She reported atypical chest pain and PPI was added.  Patient was last seen for 925 reporting chest tightness and pressure lasting 2 to 3-minute in the early morning or at nighttime.  Cardiac PET stress was ordered.  Cardiac PET stress was abnormal, evidence of ischemia with a large defect with severe reduction in uptake present in the apical to basal anterolateral and inferolateral location that is partially reversible, normal wall motion and defect area, consistent with ischemia and possible small area of infarction.  EF 66%.  Coronary calcium present in the LAD left circumflex and RCA.  Study was high risk.  Today, the patient reports she has not had any further chest tightness or pressure since the stress test. The patient is very functional at home. She has a history of 1 year of chest tightness, may occur after walking. It may last 3 minutes.  Cardiac cath was discussed in depth. Her sister is going through bilateral knee replacements and she has not had her dental procedure, she is not sure when this will occur.  Studies Reviewed EKG  Interpretation Date/Time:  Thursday December 04 2023 14:54:38 EDT Ventricular Rate:  77 PR Interval:  162 QRS Duration:  76 QT Interval:  376 QTC Calculation: 425 R Axis:   -35  Text Interpretation: Normal sinus rhythm Left axis deviation Cannot rule out Anterior infarct , age undetermined When compared with ECG of 24-Sep-2023 11:31, No significant change was found Confirmed by Gennaro Khat, Yvette Woodward ([id number]) on 12/04/2023 3:03:54 PM    Cardiac PET stress 11/2023   LV perfusion is abnormal. There is evidence of ischemia. Defect 1: There is a large defect with severe reduction in uptake present in the apical to basal anterolateral and inferolateral location(s) that is partially reversible. There is normal wall motion in the defect area. This is consistent with ischemia and possible small area of infarction.   Rest left ventricular function is normal. Rest EF: 67%. Stress left ventricular function is normal. Stress EF: 66%. End diastolic cavity size is normal.   Myocardial blood flow was computed to be 1.45ml/g/min at rest and 2.57ml/g/min at stress. Global myocardial blood flow reserve was 1.37 and was abnormal.   Coronary calcium was present on the attenuation correction CT images. Moderate coronary calcifications were present. Coronary calcifications were present in the left anterior descending artery, left circumflex artery and right coronary artery distribution(s).   Findings are consistent with ischemia. The study is high risk.     Physical Exam VS:  BP 134/88   Pulse 77   Ht 5' 3 (1.6 m)   Wt 135 lb 6.4  oz (61.4 kg)   SpO2 98%   BMI 23.99 kg/m    Wt Readings from Last 3 Encounters:  12/04/23 135 lb 6.4 oz (61.4 kg)  09/24/23 129 lb 6.4 oz (58.7 kg)  06/26/23 121 lb (54.9 kg)    GEN: Well nourished, well developed in no acute distress NECK: No JVD; No carotid bruits CARDIAC: RRR, no murmurs, rubs, gallops RESPIRATORY:  Clear to auscultation without rales, wheezing or rhonchi  ABDOMEN: Soft,  non-tender, non-distended EXTREMITIES:  No edema; No deformity   ASSESSMENT AND PLAN  Chest pain Abnormal stress test Cardiac stress test was abnormal with evidence of ischemia, high risk study (report above). She reports history of chest pain for 1 year, but no pain since the stress test. Heart cath was discussed in detail. She is very functional and lives at assisted living. At this time, patient reluctant to pursue cardiac catheterization. I will start Imdur 15mg  daily and send in SL NTG. We will see her back in 1 month to assess symptoms. Patient is also reluctant to do cath since her sister is going through b/l knee replacements and is needing help.   Paroxysmal Afib The patient is in NSR today. Continue eliquis  5mg  BID and Toprol  50mg  daily.        Dispo: Follow-up in 1 month  Signed, Yvette Riches Rebekah Canada, PA-C

## 2023-12-04 NOTE — Patient Instructions (Addendum)
 Medication Instructions:  Your physician recommends the following medication changes.  START TAKING: Imdur 15 mg by mouth daily A prescription has been sent in for Nitroglycerin.  If you have chest pain that doesn't relieve quickly, place one tablet under your tongue and allow it to dissolve.  If no relief after 5 minutes, you may take another pill.  If no relief after 5 minutes, you may take a 3rd dose but you need to call 911 and report to ER immediately.    *If you need a refill on your cardiac medications before your next appointment, please call your pharmacy*  Lab Work: No labs ordered today    Testing/Procedures: No test ordered today   Follow-Up: At [facility], you and your health needs are our priority.  As part of our continuing mission to provide you with exceptional heart care, our providers are all part of one team.  This team includes your primary Cardiologist (physician) and Advanced Practice Providers or APPs (Physician Assistants and Nurse Practitioners) who all work together to provide you with the care you need, when you need it.  Your next appointment:   1 month(s)  Provider:   Toribio Frees, PA-C

## 2024-01-01 DIAGNOSIS — I480 Paroxysmal atrial fibrillation: Secondary | ICD-10-CM | POA: Diagnosis not present

## 2024-01-01 DIAGNOSIS — I200 Unstable angina: Secondary | ICD-10-CM | POA: Diagnosis not present

## 2024-01-01 DIAGNOSIS — F5102 Adjustment insomnia: Secondary | ICD-10-CM | POA: Diagnosis not present

## 2024-01-01 DIAGNOSIS — Z79899 Other long term (current) drug therapy: Secondary | ICD-10-CM | POA: Diagnosis not present

## 2024-01-01 DIAGNOSIS — N200 Calculus of kidney: Secondary | ICD-10-CM | POA: Diagnosis not present

## 2024-01-06 ENCOUNTER — Encounter: Payer: Self-pay | Admitting: Medical

## 2024-01-06 ENCOUNTER — Ambulatory Visit: Attending: Medical | Admitting: Medical

## 2024-01-06 VITALS — BP 158/100 | HR 78 | Ht 63.0 in | Wt 136.6 lb

## 2024-01-06 DIAGNOSIS — I10 Essential (primary) hypertension: Secondary | ICD-10-CM

## 2024-01-06 DIAGNOSIS — R9439 Abnormal result of other cardiovascular function study: Secondary | ICD-10-CM | POA: Diagnosis not present

## 2024-01-06 DIAGNOSIS — I480 Paroxysmal atrial fibrillation: Secondary | ICD-10-CM | POA: Diagnosis not present

## 2024-01-06 DIAGNOSIS — E782 Mixed hyperlipidemia: Secondary | ICD-10-CM

## 2024-01-06 DIAGNOSIS — I25811 Atherosclerosis of native coronary artery of transplanted heart without angina pectoris: Secondary | ICD-10-CM | POA: Diagnosis not present

## 2024-01-06 MED ORDER — ROSUVASTATIN CALCIUM 40 MG PO TABS: 40.0000 mg | ORAL_TABLET | Freq: Every day | ORAL | 3 refills | Status: AC

## 2024-01-06 MED ORDER — ISOSORBIDE MONONITRATE ER 30 MG PO TB24
30.0000 mg | ORAL_TABLET | Freq: Every day | ORAL | 3 refills | Status: DC
Start: 2024-01-06 — End: 2024-02-25

## 2024-01-06 NOTE — Progress Notes (Unsigned)
 Cardiology Office Note   Date:  01/08/2024  ID:  Yvette Woodward, DOB 1945-12-25, MRN [id number] PCP: Yvette Bail, MD  [facility] Providers Cardiologist:  Lonni Hanson, MD     History of Present Illness Yvette Woodward is a 78 y.o. female with a hx of atrial fibrillation, kidney stones, CAD, hypertension, hyperlipidemia is being seen for follow-up for abnormal stress test and chest pain.   Patient was admitted in September 2024 with septic shock, AKI, dental infection, malnutrition, thrombocytopenia, new onset A-fib, and nonobstructive kidney stone.  Patient briefly required vasopressors.  She was noted to have new onset A-fib started on IV heparin .  CHA2DS2-VASc of 3.  Echo showed EF of 55%.  Heparin  was transition to Eliquis  5 mg twice daily.  Patient was started on rate control with Toprol  50 mg daily.     The patient was seen 04/07/23 and was feeling much better. She was in NSR. She reported atypical chest pain and PPI was added.   Patient was seen 09/24/23 reporting chest tightness and pressure lasting 2 to 3-minute in the early morning or at nighttime.  Cardiac PET stress was ordered.  Cardiac PET stress was abnormal, evidence of ischemia with a large defect with severe reduction in uptake present in the apical to basal anterolateral and inferolateral location that is partially reversible, normal wall motion and defect area, consistent with ischemia and possible small area of infarction.  EF 66%.  Coronary calcium  present in the LAD left circumflex and RCA.  Study was high risk.  Patient was seen in follow-up 12/04/2023 reporting no reoccurrence of chest pain.  Cardiac cath was discussed in depth, but patient wanted to wait due to her sister undergoing procedure.  Today, the patient is overall doing well. BP is high today. She took her medication this AM. Repeat BP 158/100. Chest pain has greatly improved with the Imdur . She may have chest pain every few days, but nothing  severe. She is still unsure about having the heart cath since her sister recently underwent knee surgery and is still needing help.   Studies Reviewed EKG Interpretation Date/Time:  Tuesday January 06 2024 15:16:10 EDT Ventricular Rate:  78 PR Interval:  158 QRS Duration:  74 QT Interval:  384 QTC Calculation: 437 R Axis:   -32  Text Interpretation: Normal sinus rhythm Left axis deviation Minimal voltage criteria for LVH, may be normal variant ( R in aVL ) Possible Anterior infarct (cited on or before 04-Dec-2023) When compared with ECG of 04-Dec-2023 14:54, Nonspecific T wave abnormality now evident in Anterior leads Confirmed by Franchester, Yvette Woodward ([id number]) on 01/06/2024 3:27:29 PM    Cardiac PET stress 11/2023   LV perfusion is abnormal. There is evidence of ischemia. Defect 1: There is a large defect with severe reduction in uptake present in the apical to basal anterolateral and inferolateral location(s) that is partially reversible. There is normal wall motion in the defect area. This is consistent with ischemia and possible small area of infarction.   Rest left ventricular function is normal. Rest EF: 67%. Stress left ventricular function is normal. Stress EF: 66%. End diastolic cavity size is normal.   Myocardial blood flow was computed to be 1.89ml/g/min at rest and 2.57ml/g/min at stress. Global myocardial blood flow reserve was 1.37 and was abnormal.   Coronary calcium  was present on the attenuation correction CT images. Moderate coronary calcifications were present. Coronary calcifications were present in the left anterior descending artery, left circumflex artery and right  coronary artery distribution(s).   Findings are consistent with ischemia. The study is high risk.      Echo 02/2023 1. Left ventricular ejection fraction, by estimation, is 55 to 60%. The  left ventricle has normal function. The left ventricle has no regional  wall motion abnormalities. There is mild left ventricular  hypertrophy.  Left ventricular diastolic parameters  were normal. The average left ventricular global longitudinal strain is  -16.5 %. The global longitudinal strain is normal.   2. Right ventricular systolic function is normal. The right ventricular  size is normal.   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   4. The aortic valve is tricuspid. Aortic valve regurgitation is not  visualized.   5. The inferior vena cava is normal in size with greater than 50%  respiratory variability, suggesting right atrial pressure of 3 mmHg.          Physical Exam VS:  BP (!) 158/100   Pulse 78   Ht 5' 3 (1.6 m)   Wt 136 lb 9.6 oz (62 kg)   SpO2 97%   BMI 24.20 kg/m        Wt Readings from Last 3 Encounters:  01/06/24 136 lb 9.6 oz (62 kg)  12/04/23 135 lb 6.4 oz (61.4 kg)  09/24/23 129 lb 6.4 oz (58.7 kg)    GEN: Well nourished, well developed in no acute distress NECK: No JVD; No carotid bruits CARDIAC: RRR, no murmurs, rubs, gallops RESPIRATORY:  Clear to auscultation without rales, wheezing or rhonchi  ABDOMEN: Soft, non-tender, non-distended EXTREMITIES:  No edema; No deformity   ASSESSMENT AND PLAN  Chest pain Abnormal stress test  CAD Cardiac stress test last month was abnormal with evidence of ischemia, high risk study (report above).  Patient was started on Imdur  for chest pain and symptoms have greatly improved.  She may have a dull episode every few days.  Patient is functional and walks daily at her assisted living facility.  At the last visit, left heart cath was discussed, but patient wanted to wait as her sister was undergoing knee surgery.  Her sister still has 1 more need to undergo.  Patient is also needing dental surgery and procedure for kidney stone removal.  At this time, patient would like to continue to monitor symptoms.  I will increase Imdur  to 30 mg daily.  We  will see her back in 30 days, at this time patient may schedule for heart cath.  No aspirin given Eliquis .  I will start her on Crestor .  Continue Toprol , SL NTG and Imdur .  Hyperlipidemia LDL 114, goal less than 70.  I will start her on Crestor  40 mg daily.  Repeat LFTs and let fasted lipid panel at follow-up.  Hypertension Blood pressure is elevated today, which is abnormal for the patient.  Increase Imdur  as above.  Continue Toprol  50 mg daily.  We will reassess at follow-up.  Paroxysmal A-fib Patient is in normal sinus rhythm today.  Continue Eliquis  5 mg twice daily and Toprol  50 mg daily.       Dispo: Follow-up in 1 month  Signed, Fitzgerald Dunne VEAR Fishman, PA-C

## 2024-01-06 NOTE — Patient Instructions (Signed)
 Medication Instructions:  Your physician recommends the following medication changes.  START TAKING: Crestor  40 mg Daily  INCREASE: Imdur  to 30 mg daily  *If you need a refill on your cardiac medications before your next appointment, please call your pharmacy*  Lab Work: No labs ordered today  If you have labs (blood work) drawn today and your tests are completely normal, you will receive your results only by: MyChart Message (if you have MyChart) OR A paper copy in the mail If you have any lab test that is abnormal or we need to change your treatment, we will call you to review the results.  Testing/Procedures: No test ordered today   Follow-Up: At [facility], you and your health needs are our priority.  As part of our continuing mission to provide you with exceptional heart care, our providers are all part of one team.  This team includes your primary Cardiologist (physician) and Advanced Practice Providers or APPs (Physician Assistants and Nurse Practitioners) who all work together to provide you with the care you need, when you need it.  Your next appointment:   1 month(s)  Provider:   Cadence Franchester, PA-C    We recommend signing up for the patient portal called MyChart.  Sign up information is provided on this After Visit Summary.  MyChart is used to connect with patients for Virtual Visits (Telemedicine).  Patients are able to view lab/test results, encounter notes, upcoming appointments, etc.  Non-urgent messages can be sent to your provider as well.   To learn more about what you can do with MyChart, go to [email or web address].

## 2024-02-25 ENCOUNTER — Ambulatory Visit: Attending: Medical | Admitting: Medical

## 2024-02-25 ENCOUNTER — Encounter: Payer: Self-pay | Admitting: Medical

## 2024-02-25 VITALS — BP 140/90 | HR 76 | Ht 63.0 in | Wt 141.4 lb

## 2024-02-25 DIAGNOSIS — E782 Mixed hyperlipidemia: Secondary | ICD-10-CM | POA: Diagnosis not present

## 2024-02-25 DIAGNOSIS — I25811 Atherosclerosis of native coronary artery of transplanted heart without angina pectoris: Secondary | ICD-10-CM

## 2024-02-25 DIAGNOSIS — I10 Essential (primary) hypertension: Secondary | ICD-10-CM

## 2024-02-25 DIAGNOSIS — I480 Paroxysmal atrial fibrillation: Secondary | ICD-10-CM | POA: Diagnosis not present

## 2024-02-25 DIAGNOSIS — I4891 Unspecified atrial fibrillation: Secondary | ICD-10-CM | POA: Diagnosis not present

## 2024-02-25 DIAGNOSIS — R9439 Abnormal result of other cardiovascular function study: Secondary | ICD-10-CM

## 2024-02-25 MED ORDER — ISOSORBIDE MONONITRATE ER 60 MG PO TB24: 60.0000 mg | ORAL_TABLET | Freq: Every day | ORAL | 3 refills | Status: AC

## 2024-02-25 NOTE — Patient Instructions (Signed)
  Medication Instructions:  Your physician recommends the following medication changes.  START TAKING: IMDUR  60 MG Isosorbide  mononitrate 24hr tablets   *If you need a refill on your cardiac medications before your next appointment, please call your pharmacy*  Lab Work: Your provider would like for you to have following labs drawn today direct lipid panel,LDL, LFT.    Testing/Procedures: No test ordered today   Follow-Up: At [facility], you and your health needs are our priority.  As part of our continuing mission to provide you with exceptional heart care, our providers are all part of one team.  This team includes your primary Cardiologist (physician) and Advanced Practice Providers or APPs (Physician Assistants and Nurse Practitioners) who all work together to provide you with the care you need, when you need it.  Your next appointment:   6 month(s)  Provider:   You may see Lonni Hanson, MD or one of the following Advanced Practice Providers on your designated Care Team:   Lonni Meager, NP Lesley Maffucci, PA-C Bernardino Bring, PA-C Cadence Benedict, PA-C Tylene Lunch, NP Barnie Hila, NP    We recommend signing up for the patient portal called MyChart.  Sign up information is provided on this After Visit Summary.  MyChart is used to connect with patients for Virtual Visits (Telemedicine).  Patients are able to view lab/test results, encounter notes, upcoming appointments, etc.  Non-urgent messages can be sent to your provider as well.   To learn more about what you can do with MyChart, go to [email or web address].

## 2024-02-25 NOTE — Progress Notes (Signed)
 Cardiology Office Note   Date:  02/25/2024  ID:  Yvette Woodward, DOB 02-Feb-1946, MRN [id number] PCP: Sherial Bail, MD  [facility] Providers Cardiologist:  Lonni Hanson, MD   History of Present Illness Yvette Woodward is a 78 y.o. female with a hx of atrial fibrillation, kidney stones, CAD, hypertension, hyperlipidemia is being seen for follow-up for abnormal stress test and chest pain.   Patient was admitted in September 2024 with septic shock, AKI, dental infection, malnutrition, thrombocytopenia, new onset A-fib, and nonobstructive kidney stone.  Patient briefly required vasopressors.  She was noted to have new onset A-fib started on IV heparin .  CHA2DS2-VASc of 3.  Echo showed EF of 55%.  Heparin  was transition to Eliquis  5 mg twice daily.  Patient was started on rate control with Toprol  50 mg daily.     The patient was seen 04/07/23 and was feeling much better. She was in NSR. She reported atypical chest pain and PPI was added.   Patient was seen 09/24/23 reporting chest tightness and pressure lasting 2 to 3-minute in the early morning or at nighttime.  Cardiac PET stress was ordered.  Cardiac PET stress was abnormal, evidence of ischemia with a large defect with severe reduction in uptake present in the apical to basal anterolateral and inferolateral location that is partially reversible, normal wall motion and defect area, consistent with ischemia and possible small area of infarction.  EF 66%.  Coronary calcium  present in the LAD left circumflex and RCA.  Study was high risk.  Patient was seen in follow-up 12/04/2023 reporting no reoccurrence of chest pain.  Cardiac cath was discussed in depth, but patient wanted to wait due to her sister undergoing procedure.   The patient was last seen 01/06/24 reporting dull chest pain occasionally. Imdur  was increased. She was started on Crestor .   Today, the patient is overall doing well. BP is better than last time, but is still  high. She is wanting to try Imdur  30mg  in the morning (instead of half pill twice daily). She is still having some trouble sleeping. She denies chest pain or shortness of breath. She does feel lack of energy that she feels may be from age. She would like to continue to defer heart cath at this time.  Studies Reviewed      Cardiac PET stress 11/2023   LV perfusion is abnormal. There is evidence of ischemia. Defect 1: There is a large defect with severe reduction in uptake present in the apical to basal anterolateral and inferolateral location(s) that is partially reversible. There is normal wall motion in the defect area. This is consistent with ischemia and possible small area of infarction.   Rest left ventricular function is normal. Rest EF: 67%. Stress left ventricular function is normal. Stress EF: 66%. End diastolic cavity size is normal.   Myocardial blood flow was computed to be 1.10ml/g/min at rest and 2.57ml/g/min at stress. Global myocardial blood flow reserve was 1.37 and was abnormal.   Coronary calcium  was present on the attenuation correction CT images. Moderate coronary calcifications were present. Coronary calcifications were present in the left anterior descending artery, left circumflex artery and right coronary artery distribution(s).   Findings are consistent with ischemia. The study is high risk.       Echo 02/2023 1. Left ventricular ejection fraction, by estimation, is 55 to 60%. The  left ventricle has normal function. The left ventricle has no regional  wall motion abnormalities. There is mild left ventricular  hypertrophy.  Left ventricular diastolic parameters  were normal. The average left ventricular global longitudinal strain is  -16.5 %. The global longitudinal strain is normal.   2. Right ventricular systolic function is normal. The right ventricular  size is normal.   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 There  is mild late systolic  prolapse of both leaflets of the mitral valve.   4. The aortic valve is tricuspid. Aortic valve regurgitation is not  visualized.   5. The inferior vena cava is normal in size with greater than 50%  respiratory variability, suggesting right atrial pressure of 3 mmHg.     Risk Assessment/Calculations   HYPERTENSION CONTROL Vitals:   02/25/24 1401 02/25/24 1406  BP: (!) 140/90 (!) 140/90    The patient's blood pressure is elevated above target today.  In order to address the patient's elevated BP: A current anti-hypertensive medication was adjusted today.          Physical Exam VS:  BP (!) 140/90 (BP Location: Right Arm, Patient Position: Sitting, Cuff Size: Normal)   Pulse 76   Ht 5' 3 (1.6 m)   Wt 141 lb 6 oz (64.1 kg)   SpO2 98%   BMI 25.04 kg/m        Wt Readings from Last 3 Encounters:  02/25/24 141 lb 6 oz (64.1 kg)  01/06/24 136 lb 9.6 oz (62 kg)  12/04/23 135 lb 6.4 oz (61.4 kg)    GEN: Well nourished, well developed in no acute distress NECK: No JVD; No carotid bruits CARDIAC: RRR, no murmurs, rubs, gallops RESPIRATORY:  Clear to auscultation without rales, wheezing or rhonchi  ABDOMEN: Soft, non-tender, non-distended EXTREMITIES:  No edema; No deformity   ASSESSMENT AND PLAN  Abnormal stress test CAD Patient denies further chest pain on Imdur . She denies SOB, but does note low energy. She feels this may be from age. Patient would like to continue to monitor symptoms and is not interested at cath at this time. Continue Imdur , Toprol , Crestor  and SL NTG. No ASA given Eliquis .   HLD LDL 114. She was started on Crestor . Repeat LFTS and lipid panel today.   HTN BP better, but still mildly elevated. I will increase Imdur  to 60mg  daily. Continue Toprol  50mg  daily.   Paroxysmal Afib She is in NSR on exam. Continue Toprol  for rate control and Eliquis  for stroke ppx.        Dispo: follow-up in 6 months  Signed, Julie Nay VEAR Fishman, PA-C

## 2024-02-26 ENCOUNTER — Ambulatory Visit: Payer: Self-pay | Admitting: Medical

## 2024-02-26 LAB — HEPATIC FUNCTION PANEL
ALT: 10 IU/L (ref 0–32)
AST: 18 IU/L (ref 0–40)
Albumin: 4.3 g/dL (ref 3.8–4.8)
Alkaline Phosphatase: 71 IU/L (ref 44–121)
Bilirubin Total: 0.5 mg/dL (ref 0.0–1.2)
Bilirubin, Direct: 0.17 mg/dL (ref 0.00–0.40)
Total Protein: 6.6 g/dL (ref 6.0–8.5)

## 2024-02-26 LAB — LIPID PANEL
Chol/HDL Ratio: 2 ratio (ref 0.0–4.4)
Cholesterol, Total: 152 mg/dL (ref 100–199)
HDL: 77 mg/dL (ref >39)
LDL Chol Calc (NIH): 62 mg/dL (ref 0–99)
Triglycerides: 63 mg/dL (ref 0–149)
VLDL Cholesterol Cal: 13 mg/dL (ref 5–40)

## 2024-02-26 LAB — LDL CHOLESTEROL, DIRECT: LDL Direct: 56 mg/dL (ref 0–99)

## 2024-07-07 ENCOUNTER — Other Ambulatory Visit: Payer: Self-pay | Admitting: Medical

## 2024-07-07 DIAGNOSIS — I4891 Unspecified atrial fibrillation: Secondary | ICD-10-CM

## 2024-07-23 ENCOUNTER — Other Ambulatory Visit: Payer: Self-pay | Admitting: Medical

## 2024-07-23 MED ORDER — METOPROLOL SUCCINATE ER 50 MG PO TB24: 50.0000 mg | ORAL_TABLET | Freq: Every day | ORAL | 3 refills | Status: AC
